# Patient Record
Sex: Female | Born: 2000
Health system: Southern US, Community
[De-identification: ages and names within clinical notes are randomized; demographics above are authoritative.]

## PROBLEM LIST (undated history)

## (undated) DIAGNOSIS — F909 Attention-deficit hyperactivity disorder, unspecified type: Secondary | ICD-10-CM

## (undated) DIAGNOSIS — L309 Dermatitis, unspecified: Secondary | ICD-10-CM

## (undated) DIAGNOSIS — L500 Allergic urticaria: Secondary | ICD-10-CM

## (undated) HISTORY — DX: Dermatitis, unspecified: L30.9

## (undated) HISTORY — DX: Allergic urticaria: L50.0

## (undated) HISTORY — DX: Attention-deficit hyperactivity disorder, unspecified type: F90.9

---
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
## 2019-01-08 ENCOUNTER — Other Ambulatory Visit: Payer: Self-pay | Admitting: Internal Medicine

## 2019-05-14 ENCOUNTER — Other Ambulatory Visit: Payer: Self-pay

## 2019-05-14 ENCOUNTER — Ambulatory Visit (INDEPENDENT_AMBULATORY_CARE_PROVIDER_SITE_OTHER): Payer: BC Managed Care – PPO | Admitting: Internal Medicine

## 2019-05-14 ENCOUNTER — Encounter: Payer: Self-pay | Admitting: Internal Medicine

## 2019-05-14 VITALS — BP 118/82 | HR 107 | Temp 98.1°F | Ht 66.0 in | Wt 226.0 lb

## 2019-05-14 DIAGNOSIS — F419 Anxiety disorder, unspecified: Secondary | ICD-10-CM

## 2019-05-14 DIAGNOSIS — Z0000 Encounter for general adult medical examination without abnormal findings: Secondary | ICD-10-CM | POA: Diagnosis not present

## 2019-05-14 DIAGNOSIS — R41840 Attention and concentration deficit: Secondary | ICD-10-CM

## 2019-05-14 DIAGNOSIS — F329 Major depressive disorder, single episode, unspecified: Secondary | ICD-10-CM

## 2019-05-14 DIAGNOSIS — K219 Gastro-esophageal reflux disease without esophagitis: Secondary | ICD-10-CM

## 2019-05-14 DIAGNOSIS — L500 Allergic urticaria: Secondary | ICD-10-CM

## 2019-05-14 DIAGNOSIS — K582 Mixed irritable bowel syndrome: Secondary | ICD-10-CM | POA: Diagnosis not present

## 2019-05-14 MED ORDER — ESCITALOPRAM OXALATE 10 MG PO TABS: 10.0000 mg | ORAL_TABLET | Freq: Every day | ORAL | 3 refills | Status: DC

## 2019-05-14 NOTE — Patient Instructions (Signed)
Health Maintenance, Female Adopting a healthy lifestyle and getting preventive care are important in promoting health and wellness. Ask your health care provider about:  The right schedule for you to have regular tests and exams.  Things you can do on your own to prevent diseases and keep yourself healthy. What should I know about diet, weight, and exercise? Eat a healthy diet   Eat a diet that includes plenty of vegetables, fruits, low-fat dairy products, and lean protein.  Do not eat a lot of foods that are high in solid fats, added sugars, or sodium. Maintain a healthy weight Body mass index (BMI) is used to identify weight problems. It estimates body fat based on height and weight. Your health care provider can help determine your BMI and help you achieve or maintain a healthy weight. Get regular exercise Get regular exercise. This is one of the most important things you can do for your health. Most adults should:  Exercise for at least 150 minutes each week. The exercise should increase your heart rate and make you sweat (moderate-intensity exercise).  Do strengthening exercises at least twice a week. This is in addition to the moderate-intensity exercise.  Spend less time sitting. Even light physical activity can be beneficial. Watch cholesterol and blood lipids Have your blood tested for lipids and cholesterol at 20 years of age, then have this test every 5 years. Have your cholesterol levels checked more often if:  Your lipid or cholesterol levels are high.  You are older than 20 years of age.  You are at high risk for heart disease. What should I know about cancer screening? Depending on your health history and family history, you may need to have cancer screening at various ages. This may include screening for:  Breast cancer.  Cervical cancer.  Colorectal cancer.  Skin cancer.  Lung cancer. What should I know about heart disease, diabetes, and high blood  pressure? Blood pressure and heart disease  High blood pressure causes heart disease and increases the risk of stroke. This is more likely to develop in people who have high blood pressure readings, are of African descent, or are overweight.  Have your blood pressure checked: ? Every 3-5 years if you are 18-39 years of age. ? Every year if you are 40 years old or older. Diabetes Have regular diabetes screenings. This checks your fasting blood sugar level. Have the screening done:  Once every three years after age 40 if you are at a normal weight and have a low risk for diabetes.  More often and at a younger age if you are overweight or have a high risk for diabetes. What should I know about preventing infection? Hepatitis B If you have a higher risk for hepatitis B, you should be screened for this virus. Talk with your health care provider to find out if you are at risk for hepatitis B infection. Hepatitis C Testing is recommended for:  Everyone born from 1945 through 1965.  Anyone with known risk factors for hepatitis C. Sexually transmitted infections (STIs)  Get screened for STIs, including gonorrhea and chlamydia, if: ? You are sexually active and are younger than 20 years of age. ? You are older than 20 years of age and your health care provider tells you that you are at risk for this type of infection. ? Your sexual activity has changed since you were last screened, and you are at increased risk for chlamydia or gonorrhea. Ask your health care provider if   you are at risk.  Ask your health care provider about whether you are at high risk for HIV. Your health care provider may recommend a prescription medicine to help prevent HIV infection. If you choose to take medicine to prevent HIV, you should first get tested for HIV. You should then be tested every 3 months for as long as you are taking the medicine. Pregnancy  If you are about to stop having your period (premenopausal) and  you may become pregnant, seek counseling before you get pregnant.  Take 400 to 800 micrograms (mcg) of folic acid every day if you become pregnant.  Ask for birth control (contraception) if you want to prevent pregnancy. Osteoporosis and menopause Osteoporosis is a disease in which the bones lose minerals and strength with aging. This can result in bone fractures. If you are 65 years old or older, or if you are at risk for osteoporosis and fractures, ask your health care provider if you should:  Be screened for bone loss.  Take a calcium or vitamin D supplement to lower your risk of fractures.  Be given hormone replacement therapy (HRT) to treat symptoms of menopause. Follow these instructions at home: Lifestyle  Do not use any products that contain nicotine or tobacco, such as cigarettes, e-cigarettes, and chewing tobacco. If you need help quitting, ask your health care provider.  Do not use street drugs.  Do not share needles.  Ask your health care provider for help if you need support or information about quitting drugs. Alcohol use  Do not drink alcohol if: ? Your health care provider tells you not to drink. ? You are pregnant, may be pregnant, or are planning to become pregnant.  If you drink alcohol: ? Limit how much you use to 0-1 drink a day. ? Limit intake if you are breastfeeding.  Be aware of how much alcohol is in your drink. In the [place] one drink equals one 12 oz bottle of beer (355 mL), one 5 oz glass of wine (148 mL), or one 1 oz glass of hard liquor (44 mL). General instructions  Schedule regular health, dental, and eye exams.  Stay current with your vaccines.  Tell your health care provider if: ? You often feel depressed. ? You have ever been abused or do not feel safe at home. Summary  Adopting a healthy lifestyle and getting preventive care are important in promoting health and wellness.  Follow your health care provider's instructions about healthy  diet, exercising, and getting tested or screened for diseases.  Follow your health care provider's instructions on monitoring your cholesterol and blood pressure. This information is not intended to replace advice given to you by your health care provider. Make sure you discuss any questions you have with your health care provider. Document Revised: 04/04/2018 Document Reviewed: 04/04/2018 Elsevier Patient Education  2020 [employer] Inc.  

## 2019-05-14 NOTE — Assessment & Plan Note (Signed)
Continue Xyzal. 

## 2019-05-14 NOTE — Progress Notes (Signed)
Subjective:    Patient ID: Jill Spencer, female    DOB: 2000-05-14, 19 y.o.   MRN: [id number]  HPI  Pt presents to the clinic today for her annual exam. She is also due to follow up chronic conditions. Patient reports joint pain likely due to fibromyalgia, rheumatology ruled out a rheumatologic cause last year. She reports forgetfulness and lack of motivation and would like to be evaluated for ADHD.   Recurrent Urticaria: Triggered by scratching, nickel, and dust which she avoids. She takes Xyzal daily for prevention.    IBS: Alternating constipation and diarrhea. Triggered by soda and dairy which she avoids. She is not taking any medications OTC for this.  GERD: She cannot identify any specific triggers. She takes Tums as needed with good relief of symptoms. There is no upper GI on file.   Anxiety and Depression: Deteriorated off Escitalopram. She would like to get restarted on this.  She is not currently seeing a therapist. She denies SI/HI.    Flu: 01/2018 Tetanus: 12/2009 Dentist: biannually Vision screening: annually  Diet: Eats meat, fruits, and veggies. Avoids fried foods. Drinks mostly water and sweet tea.  Exercise: No formal exercise.   Review of Systems      Past Medical History:  Diagnosis Date  . Eczema   . Recurrent urticaria 05/09/2016    Current Outpatient Medications  Medication Sig Dispense Refill  . diclofenac (VOLTAREN) 75 MG EC tablet Take 1 tablet (75 mg total) by mouth 2 (two) times daily. 14 tablet 0  . EPINEPHrine (EPIPEN 2-PAK) 0.3 mg/0.3 mL IJ SOAJ injection Inject 0.3 mLs (0.3 mg total) into the muscle once. 2 Device 0  . escitalopram (LEXAPRO) 10 MG tablet Take 1 tablet (10 mg total) by mouth daily. 30 tablet 11  . fluticasone (FLONASE) 50 MCG/ACT nasal spray Place 2 sprays into both nostrils daily as needed for allergies or rhinitis. 16 g 1  . levocetirizine (XYZAL) 5 MG tablet TAKE 1 TABLET BY MOUTH AT BEDTIME AS NEEDED FOR ALLERGIES 90  tablet 0  . ranitidine (ZANTAC) 150 MG tablet Take 150 mg by mouth 2 (two) times daily as needed for heartburn.     No current facility-administered medications for this visit.    Allergies  Allergen Reactions  . Latex Hives  . Sulfa Antibiotics Other (See Comments)    Flu like sx including fever    Family History  Problem Relation Age of Onset  . Hypertension Mother   . Hyperlipidemia Mother   . Cancer Maternal Grandfather        brain   . Diabetes Paternal Grandmother   . Congestive Heart Failure Paternal Grandmother   . Hypertension Father   . Eczema Paternal Uncle     Social History   Socioeconomic History  . Marital status: Single    Spouse name: Not on file  . Number of children: Not on file  . Years of education: Not on file  . Highest education level: Not on file  Occupational History  . Not on file  Tobacco Use  . Smoking status: Passive Smoke Exposure - Never Smoker  . Smokeless tobacco: Never Used  . Tobacco comment: parents, but do not smoke in home  Substance and Sexual Activity  . Alcohol use: No    Alcohol/week: 0.0 standard drinks  . Drug use: No  . Sexual activity: Never  Other Topics Concern  . Not on file  Social History Narrative  . Not on file  Social Determinants of Health   Financial Resource Strain:   . Difficulty of Paying Living Expenses: Not on file  Food Insecurity:   . Worried About Charity fundraiser in the Last Year: Not on file  . Ran Out of Food in the Last Year: Not on file  Transportation Needs:   . Lack of Transportation (Medical): Not on file  . Lack of Transportation (Non-Medical): Not on file  Physical Activity:   . Days of Exercise per Week: Not on file  . Minutes of Exercise per Session: Not on file  Stress:   . Feeling of Stress : Not on file  Social Connections:   . Frequency of Communication with Friends and Family: Not on file  . Frequency of Social Gatherings with Friends and Family: Not on file  .  Attends Religious Services: Not on file  . Active Member of Clubs or Organizations: Not on file  . Attends Archivist Meetings: Not on file  . Marital Status: Not on file  Intimate Partner Violence:   . Fear of Current or Ex-Partner: Not on file  . Emotionally Abused: Not on file  . Physically Abused: Not on file  . Sexually Abused: Not on file     Constitutional: Pt reports headaches and fatigue. Denies fever, malaise, or abrupt weight changes.  HEENT: Denies eye pain, eye redness, ear pain, ringing in the ears, wax buildup, runny nose, nasal congestion, bloody nose, or sore throat. Respiratory: Denies difficulty breathing, shortness of breath, cough or sputum production.   Cardiovascular: Denies chest pain, chest tightness, palpitations or swelling in the hands or feet.  Gastrointestinal: Pt reports constipation and diarrhea. Denies abdominal pain, bloating, or blood in the stool.  GU: Denies urgency, frequency, pain with urination, burning sensation, blood in urine, odor or discharge. Musculoskeletal: Pt reports joint pain and muscle pain. Denies decrease in range of motion, difficulty with gait, or muscle pain. Skin: Denies redness, rashes, lesions or ulcercations.  Neurological: Pt reports inattention. Denies dizziness, difficulty with speech or problems with balance and coordination.  Psych: Pt reports anxiety and depression. Denies SI/HI.  No other specific complaints in a complete review of systems (except as listed in HPI above).  Objective:   Physical Exam BP 118/82   Pulse (!) 107   Temp 98.1 F (36.7 C) (Temporal)   Ht 5\' 6"  (1.676 m)   Wt 226 lb (102.5 kg)   LMP 05/12/2019   SpO2 98%   BMI 36.48 kg/m   Wt Readings from Last 3 Encounters:  02/05/18 226 lb 6.4 oz (102.7 kg) (99 %, Z= 2.26)*  07/27/17 227 lb (103 kg) (99 %, Z= 2.27)*  02/14/17 226 lb (102.5 kg) (99 %, Z= 2.27)*   * Growth percentiles are based on CDC (Girls, 2-20 Years) data.     General: Appears her stated age, obese, in NAD. Skin: Warm, dry and intact. No rashes noted. HEENT: Head: normal shape and size; Eyes: sclera white, no icterus, conjunctiva pink, PERRLA and EOMs intact.  Cardiovascular: Normal rate and rhythm. S1,S2 noted.  No murmur, rubs or gallops noted. No JVD or BLE edema.  Pulmonary/Chest: Normal effort and positive vesicular breath sounds. No respiratory distress. No wheezes, rales or ronchi noted.  Abdomen: Soft and nontender. Normal bowel sounds. No distention or masses noted. Liver, spleen and kidneys non palpable. Musculoskeletal:  Strength 5/5 BLE/BUE. No difficulty with gait. Neurological: Alert and oriented. Cranial nerves II-XII grossly intact. Coordination normal.  Psychiatric: Depressed mood  with flat affect. Judgment and thought content normal.     BMET    Component Value Date/Time   NA 141 06/21/2016 1430   K 3.9 06/21/2016 1430   CL 107 06/21/2016 1430   CO2 23 06/21/2016 1430   GLUCOSE 86 06/21/2016 1430   BUN 8 06/21/2016 1430   CREATININE 0.61 06/21/2016 1430   CALCIUM 9.3 06/21/2016 1430    Lipid Panel  No results found for: CHOL, TRIG, HDL, CHOLHDL, VLDL, LDLCALC  CBC    Component Value Date/Time   WBC 7.8 06/21/2016 1430   RBC 4.71 06/21/2016 1430   HGB 11.4 (L) 06/21/2016 1430   HCT 36.3 06/21/2016 1430   PLT 291 06/21/2016 1430   MCV 77.1 (L) 06/21/2016 1430   MCH 24.2 (L) 06/21/2016 1430   MCHC 31.4 06/21/2016 1430   RDW 16.4 (H) 06/21/2016 1430   LYMPHSABS 2,730 06/21/2016 1430   MONOABS 468 06/21/2016 1430   EOSABS 312 06/21/2016 1430   BASOSABS 78 06/21/2016 1430    Hgb A1C No results found for: HGBA1C          Assessment & Plan:   Preventative Health Maintenance:  She declines flu shot today Tetanus: UTD Encouraged her to consume a balanced diet and exercise regimen Advised her to see a dentist annually Will check CBC, CMET and Lipid profile today Referred to psychology for  inattention.  Inattention:  She has concerns about ADD Referral to Dr. Reggy Eye for ADD testing  RTC in 1 year, sooner if needed Nicki Reaper, NP This visit occurred during the SARS-CoV-2 public health emergency.  Safety protocols were in place, including screening questions prior to the visit, additional usage of staff PPE, and extensive cleaning of exam room while observing appropriate contact time as indicated for disinfecting solutions.

## 2019-05-14 NOTE — Assessment & Plan Note (Signed)
Managed with diet Will monitor 

## 2019-05-14 NOTE — Assessment & Plan Note (Signed)
Deteriorated Will restart Escitalopram Discussed the importance of taking medication as prescribed, not abruptly stopping this medication Support offered today

## 2019-05-14 NOTE — Assessment & Plan Note (Signed)
Continue Tums as needed Avoid foods that trigger your reflux

## 2019-05-15 LAB — CBC
HCT: 36.7 % (ref 36.0–49.0)
Hemoglobin: 11.5 g/dL — ABNORMAL LOW (ref 12.0–16.0)
MCHC: 31.4 g/dL (ref 31.0–37.0)
MCV: 75.7 fl — ABNORMAL LOW (ref 78.0–98.0)
Platelets: 279 10*3/uL (ref 150.0–575.0)
RBC: 4.84 Mil/uL (ref 3.80–5.70)
RDW: 17 % — ABNORMAL HIGH (ref 11.4–15.5)
WBC: 6.4 10*3/uL (ref 4.5–13.5)

## 2019-05-15 LAB — COMPREHENSIVE METABOLIC PANEL
ALT: 14 U/L (ref 0–35)
AST: 16 U/L (ref 0–37)
Albumin: 4.1 g/dL (ref 3.5–5.2)
Alkaline Phosphatase: 79 U/L (ref 47–119)
BUN: 8 mg/dL (ref 6–23)
CO2: 27 mEq/L (ref 19–32)
Calcium: 9.4 mg/dL (ref 8.4–10.5)
Chloride: 105 mEq/L (ref 96–112)
Creatinine, Ser: 0.65 mg/dL (ref 0.40–1.20)
GFR: 116.42 mL/min (ref >60.00)
Glucose, Bld: 96 mg/dL (ref 70–99)
Potassium: 4.2 mEq/L (ref 3.5–5.1)
Sodium: 138 mEq/L (ref 135–145)
Total Bilirubin: 0.3 mg/dL (ref 0.2–1.2)
Total Protein: 7.6 g/dL (ref 6.0–8.3)

## 2019-05-15 LAB — LDL CHOLESTEROL, DIRECT: Direct LDL: 89 mg/dL

## 2019-05-15 LAB — LIPID PANEL
Cholesterol: 168 mg/dL (ref 0–200)
HDL: 36.9 mg/dL — ABNORMAL LOW (ref >39.00)
NonHDL: 131.43
Total CHOL/HDL Ratio: 5
Triglycerides: 400 mg/dL — ABNORMAL HIGH (ref 0.0–149.0)
VLDL: 80 mg/dL — ABNORMAL HIGH (ref 0.0–40.0)

## 2019-06-07 ENCOUNTER — Other Ambulatory Visit: Payer: Self-pay | Admitting: Internal Medicine

## 2019-06-07 DIAGNOSIS — J3089 Other allergic rhinitis: Secondary | ICD-10-CM

## 2019-06-27 ENCOUNTER — Other Ambulatory Visit: Payer: Self-pay

## 2019-06-27 NOTE — Progress Notes (Signed)
COLTON ENGDAHL DOB: 2001/03/07 Encounter date: 06/28/2019  This is a 19 y.o. female who presents to establish care. Chief Complaint  Patient presents with  . Establish Care    History of present illness: Very tired, and anxious today. Sleep schedule is always out of whack.   Has chronic pain. Talked with last doctor about this and was told there wasn't anything to do.   Joints always hurt, pop all the time. Saw rheumatology in past and was told she was hypermobile. Sometimes specific joint bothers her for period of time - like a couple of months ago was her right knee. Has been having issue for 3-4 years. Seemed to get worse quickly. Her brother has similar issues - turning 16 in 2 days. Saw peds rheumatology in 02/2017.   Started with hands and wrists, but thinks this was due to her being in high school and having art classes. Hasn't been able to see swelling within joints. Takes ibuprofen when it hurts and sometimes it helps. Takes 400mg  just occasionally. At worst, pain is 6/10. This has been very limiting for her.  Eczema: hasn't had medicine for this since she was little. Gets flares elbows, around eyes, and middle of back. Using a lot of lotion. Uses aveno.   Allergic to a lot of things. At time of joint issues was very stressed out and then started with hives. Also has dermatographia. Has issues with grasses, pollen.   Occasional bouts with irritable bowels - just more pain; not constipation/not diarrhea.  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   Past Medical History:  Diagnosis Date  . Eczema   . Recurrent urticaria 05/09/2016   History reviewed. No pertinent surgical history. Allergies  Allergen Reactions  . Dust Mite Extract   . Latex Hives  . Shellfish Allergy   . Sulfa Antibiotics Other (See Comments)    Flu like sx including fever   No outpatient medications have been marked as taking for the 06/28/19 encounter  (Office Visit) with 08/28/19, MD.   Social History   Tobacco Use  . Smoking status: Passive Smoke Exposure - Never Smoker  . Smokeless tobacco: Never Used  . Tobacco comment: parents, but do not smoke in home  Substance Use Topics  . Alcohol use: No    Alcohol/week: 0.0 standard drinks   Family History  Problem Relation Age of Onset  . Hypertension Mother   . Hyperlipidemia Mother   . Cancer Maternal Grandfather        brain   . Diabetes Paternal Grandmother   . Congestive Heart Failure Paternal Grandmother   . Hypertension Father   . Arthritis Father   . Eczema Paternal Uncle   . Cancer Paternal Grandfather        skin  . Migraines Sister   . Multiple sclerosis Other        paternal aunt; not entirely sure of diagnosis     Review of Systems  Constitutional: Negative for chills, fatigue and fever.  Respiratory: Negative for cough, chest tightness, shortness of breath and wheezing.   Cardiovascular: Negative for chest pain, palpitations and leg swelling.  Musculoskeletal: Positive for arthralgias.    Objective:  BP 116/80 (BP Location: Right Arm, Patient Position: Sitting, Cuff Size: Large)   Pulse (!) 134   Temp 97.9 F (36.6 C) (Temporal)   Ht 5\' 6"  (1.676 m)   Wt 222 lb (100.7 kg)  LMP 06/16/2019 (Approximate)   BMI 35.83 kg/m   Weight: 222 lb (100.7 kg)   BP Readings from Last 3 Encounters:  06/28/19 116/80  05/14/19 118/82  05/08/18 131/74   Wt Readings from Last 3 Encounters:  06/28/19 222 lb (100.7 kg) (99 %, Z= 2.23)*  05/14/19 226 lb (102.5 kg) (99 %, Z= 2.28)*  02/05/18 226 lb 6.4 oz (102.7 kg) (99 %, Z= 2.26)*   * Growth percentiles are based on CDC (Girls, 2-20 Years) data.    Physical Exam Constitutional:      General: She is not in acute distress.    Appearance: She is well-developed.  Cardiovascular:     Rate and Rhythm: Normal rate and regular rhythm.     Heart sounds: Normal heart sounds. No murmur. No friction rub.   Pulmonary:     Effort: Pulmonary effort is normal. No respiratory distress.     Breath sounds: Normal breath sounds. No wheezing or rales.  Musculoskeletal:     Right lower leg: No edema.     Left lower leg: No edema.     Comments: No joint swelling appreciated in hands.  She is very flexible with ability to bend thumb entirely back onto forearm. Can bend pinky back past 90 degrees.   Neurological:     Mental Status: She is alert and oriented to person, place, and time.  Psychiatric:        Behavior: Behavior normal.     Assessment/Plan:  1. Anemia, unspecified type - Iron, TIBC and Ferritin Panel; Future - Iron, TIBC and Ferritin Panel  2. Arthralgia, unspecified joint Start with bloodwork; consider further evaluation pending results. She does have hypermobility of joints; if bloodwork is negative for autoimmune disease/inflammatory concern, I would consider further evaluation for her hypermobile joints. She is also willing to try physical therapy to help with pain/discomfort. - ANA; Future - Rheumatoid factor; Future - Sedimentation rate; Future - C-reactive protein; Future - C-reactive protein - Sedimentation rate - Rheumatoid factor - ANA - B. burgdorfi antibodies  3. Fatigue, unspecified type - Vitamin B12; Future - TSH; Future - VITAMIN D 25 Hydroxy (Vit-D Deficiency, Fractures); Future - VITAMIN D 25 Hydroxy (Vit-D Deficiency, Fractures) - TSH - Vitamin B12  4. Eczema, unspecified type Has been able to control with lotion and rare use of steroid cream.   Return for pending lab results.  Micheline Rough, MD

## 2019-06-28 ENCOUNTER — Ambulatory Visit (INDEPENDENT_AMBULATORY_CARE_PROVIDER_SITE_OTHER): Payer: BC Managed Care – PPO | Admitting: Family Medicine

## 2019-06-28 ENCOUNTER — Encounter: Payer: Self-pay | Admitting: Family Medicine

## 2019-06-28 VITALS — BP 116/80 | HR 134 | Temp 97.9°F | Ht 66.0 in | Wt 222.0 lb

## 2019-06-28 DIAGNOSIS — L309 Dermatitis, unspecified: Secondary | ICD-10-CM

## 2019-06-28 DIAGNOSIS — D649 Anemia, unspecified: Secondary | ICD-10-CM | POA: Diagnosis not present

## 2019-06-28 DIAGNOSIS — M2550 Pain in unspecified joint: Secondary | ICD-10-CM | POA: Diagnosis not present

## 2019-06-28 DIAGNOSIS — R5383 Other fatigue: Secondary | ICD-10-CM | POA: Diagnosis not present

## 2019-06-28 DIAGNOSIS — R768 Other specified abnormal immunological findings in serum: Secondary | ICD-10-CM

## 2019-06-28 MED ORDER — TRIAMCINOLONE ACETONIDE 0.1 % EX CREA: 1.0000 "application " | TOPICAL_CREAM | Freq: Two times a day (BID) | CUTANEOUS | 0 refills | Status: DC

## 2019-06-28 NOTE — Patient Instructions (Signed)
Consider original eucerin (or aquaphor).

## 2019-07-01 LAB — C-REACTIVE PROTEIN: CRP: 10.7 mg/L — ABNORMAL HIGH (ref <8.0)

## 2019-07-01 LAB — IRON,TIBC AND FERRITIN PANEL
%SAT: 6 % (calc) — ABNORMAL LOW (ref 15–45)
Ferritin: 13 ng/mL — ABNORMAL LOW (ref 16–154)
Iron: 25 ug/dL — ABNORMAL LOW (ref 27–164)
TIBC: 414 mcg/dL (calc) (ref 271–448)

## 2019-07-01 LAB — ANA: Anti Nuclear Antibody (ANA): POSITIVE — AB

## 2019-07-01 LAB — RHEUMATOID FACTOR: Rheumatoid fact SerPl-aCnc: <14 IU/mL (ref <14)

## 2019-07-01 LAB — ANTI-NUCLEAR AB-TITER (ANA TITER): ANA Titer 1: 1:40 {titer} — ABNORMAL HIGH

## 2019-07-01 LAB — VITAMIN B12: Vitamin B-12: 601 pg/mL (ref 200–1100)

## 2019-07-01 LAB — SEDIMENTATION RATE: Sed Rate: 31 mm/h — ABNORMAL HIGH (ref 0–20)

## 2019-07-01 LAB — VITAMIN D 25 HYDROXY (VIT D DEFICIENCY, FRACTURES): Vit D, 25-Hydroxy: 9 ng/mL — ABNORMAL LOW (ref 30–100)

## 2019-07-01 LAB — TSH: TSH: 1.78 mIU/L

## 2019-07-01 LAB — B. BURGDORFI ANTIBODIES: B burgdorferi Ab IgG+IgM: <0.9 index

## 2019-07-03 MED ORDER — FERROUS SULFATE 325 (65 FE) MG PO TABS: 325.0000 mg | ORAL_TABLET | Freq: Every day | ORAL | 3 refills | Status: DC

## 2019-07-03 MED ORDER — VITAMIN D (ERGOCALCIFEROL) 1.25 MG (50000 UNIT) PO CAPS: 50000.0000 [IU] | ORAL_CAPSULE | ORAL | 1 refills | Status: DC

## 2019-07-03 NOTE — Addendum Note (Signed)
Addended by: Waymon Amato R on: 07/03/2019 08:24 AM   Modules accepted: Orders

## 2019-07-05 ENCOUNTER — Other Ambulatory Visit: Payer: Self-pay

## 2019-07-05 DIAGNOSIS — T7800XA Anaphylactic reaction due to unspecified food, initial encounter: Secondary | ICD-10-CM

## 2019-07-10 ENCOUNTER — Other Ambulatory Visit: Payer: Self-pay | Admitting: Internal Medicine

## 2019-07-10 DIAGNOSIS — J3089 Other allergic rhinitis: Secondary | ICD-10-CM

## 2019-07-17 ENCOUNTER — Encounter: Payer: Self-pay | Admitting: Family Medicine

## 2019-07-23 ENCOUNTER — Other Ambulatory Visit: Payer: Self-pay | Admitting: Internal Medicine

## 2019-07-23 DIAGNOSIS — J3089 Other allergic rhinitis: Secondary | ICD-10-CM

## 2019-09-02 ENCOUNTER — Ambulatory Visit (INDEPENDENT_AMBULATORY_CARE_PROVIDER_SITE_OTHER): Payer: BC Managed Care – PPO | Admitting: Psychology

## 2019-09-02 DIAGNOSIS — F419 Anxiety disorder, unspecified: Secondary | ICD-10-CM

## 2019-09-02 DIAGNOSIS — F319 Bipolar disorder, unspecified: Secondary | ICD-10-CM | POA: Diagnosis not present

## 2019-09-03 NOTE — Progress Notes (Signed)
Office Visit Note  Patient: Jill Spencer             Date of Birth: 05/22/2000           MRN: [id number]             PCP: Caren Macadam, MD Referring: Billie Ruddy, MD Visit Date: 09/11/2019 Occupation: @GUAROCC @  Subjective:  New Patient (Initial Visit) (Total body joint pain)   History of Present Illness: Jill Spencer is a 19 y.o. female seen in consultation per request of her PCP.  According to the patient her symptoms are started in 2016 with generalized pain all over.  At the time she was seen in neurology by a pediatric rheumatologist and was diagnosed with hypermobility.  She states she continues to have pain in multiple joints.  Increased activity causes increased pain.  She denies any history of joint swelling.  She describes pain in her cervical, thoracic and lumbar spine.  She also has discomfort in her left wrist joint, bilateral hands, bilateral hips, bilateral ankles and feet.  She states since 2016 she has also had episodes of recurrent urticaria.  She was evaluated by an allergist and allergy testing was positive for few things.  She was advised Xyzal which she had been taking and had been controlling her symptoms.  But recently is not covered by her insurance and she has been using some over-the-counter medications which are helpful.  Patient states she was having painful urination yesterday and was seen at urgent care.  She was also sent to emergency room to have a CT scan to look for bladder stone.  She states she waited for too long and left.  She is planning to go back there today.  She states there is history of autoimmune disease in her maternal aunt but she does not know which kind as she does not have contact with her.  There is no history of oral ulcers, nasal ulcers, malar rash, photosensitivity, lymphadenopathy or Raynaud's.  Activities of Daily Living:  Patient reports morning stiffness for 0 none.   Patient Denies nocturnal pain.  Difficulty  dressing/grooming: Denies Difficulty climbing stairs: Reports Difficulty getting out of chair: Reports Difficulty using hands for taps, buttons, cutlery, and/or writing: Denies  Review of Systems  Constitutional: Positive for fatigue. Negative for night sweats, weight gain and weight loss.  HENT: Positive for mouth dryness. Negative for mouth sores, trouble swallowing, trouble swallowing and nose dryness.   Eyes: Positive for dryness. Negative for pain, redness and visual disturbance.  Respiratory: Negative for cough, shortness of breath and difficulty breathing.   Cardiovascular: Negative for chest pain, palpitations, hypertension, irregular heartbeat and swelling in legs/feet.  Gastrointestinal: Positive for constipation and diarrhea. Negative for blood in stool.  Endocrine: Positive for cold intolerance. Negative for increased urination.  Genitourinary: Positive for difficulty urinating and painful urination. Negative for vaginal dryness.  Musculoskeletal: Positive for arthralgias, gait problem, joint pain and muscle tenderness. Negative for joint swelling, myalgias, muscle weakness, morning stiffness and myalgias.  Skin: Negative for color change, rash, hair loss, skin tightness, ulcers and sensitivity to sunlight.  Allergic/Immunologic: Negative for susceptible to infections.  Neurological: Negative for dizziness, numbness, memory loss, night sweats and weakness.  Hematological: Negative for bruising/bleeding tendency and swollen glands.  Psychiatric/Behavioral: Positive for depressed mood and sleep disturbance. The patient is nervous/anxious.     PMFS History:  Patient Active Problem List   Diagnosis Date Noted  . Acid reflux 06/20/2016  .  Recurrent urticaria 05/09/2016  . Anxiety and depression 10/13/2014  . IBS (irritable bowel syndrome) 10/13/2014    Past Medical History:  Diagnosis Date  . Eczema   . Recurrent urticaria 05/09/2016    Family History  Problem Relation Age  of Onset  . Hypertension Mother   . Hyperlipidemia Mother   . Cancer Maternal Grandfather        brain   . Diabetes Paternal Grandmother   . Congestive Heart Failure Paternal Grandmother   . Hypertension Father   . Arthritis Father   . Eczema Paternal Uncle   . Cancer Paternal Grandfather        skin  . Migraines Sister   . Multiple sclerosis Other        paternal aunt; not entirely sure of diagnosis   History reviewed. No pertinent surgical history. Social History   Social History Narrative  . Not on file   Immunization History  Administered Date(s) Administered  . DTaP 09/21/1999, 11/22/1999, 01/24/2000, 01/19/2001, 08/05/2004  . Hepatitis B 1999/11/08, 08/26/1999, 05/09/2000  . HiB (PRP-OMP) 09/21/1999, 11/22/1999, 01/24/2000, 10/20/2000  . IPV 09/21/1999, 11/22/1999, 01/19/2002, 08/05/2004  . Influenza,inj,Quad PF,6+ Mos 03/12/2015  . MMR 10/20/2000, 08/05/2004  . Meningococcal Conjugate 01/13/2010  . Meningococcal Polysaccharide 03/15/2016  . Pneumococcal Conjugate-13 09/21/1999, 11/22/1999, 01/25/2000, 06/30/2000  . Tdap 01/13/2010  . Varicella 10/20/2000     Objective: Vital Signs: BP 133/76 (BP Location: Right Arm, Patient Position: Sitting, Cuff Size: Normal)   Pulse (!) 122   Resp 14   Ht 5' 6"  (1.676 m)   Wt 225 lb (102.1 kg)   BMI 36.32 kg/m    Physical Exam Vitals and nursing note reviewed.  Constitutional:      Appearance: She is well-developed.  HENT:     Head: Normocephalic and atraumatic.  Eyes:     Conjunctiva/sclera: Conjunctivae normal.  Cardiovascular:     Rate and Rhythm: Normal rate and regular rhythm.     Heart sounds: Normal heart sounds.  Pulmonary:     Effort: Pulmonary effort is normal.     Breath sounds: Normal breath sounds.  Abdominal:     General: Bowel sounds are normal.     Palpations: Abdomen is soft.  Musculoskeletal:     Cervical back: Normal range of motion.  Lymphadenopathy:     Cervical: No cervical adenopathy.    Skin:    General: Skin is warm and dry.     Capillary Refill: Capillary refill takes less than 2 seconds.  Neurological:     Mental Status: She is alert and oriented to person, place, and time.  Psychiatric:        Behavior: Behavior normal.      Musculoskeletal Exam: C-spine thoracic and lumbar spine were in good range of motion.  She had no point tenderness no SI joint tenderness.  Shoulder joints, elbow joints, wrist joints with good range of motion.  She has some hypermobility in her elbows, wrist joints, MCPs and PIPs.  No synovitis was noted.  No joint tenderness was noted.  Hip joints were in good range of motion.  She has hyperextension of her bilateral knee joints.  She has increased mobility in her ankles.  No plantar fasciitis or Achilles tendinitis was noted. CDAI Exam: CDAI Score: -- Patient Global: --; Provider Global: -- Swollen: --; Tender: -- Joint Exam 09/11/2019   No joint exam has been documented for this visit   There is currently no information documented on the homunculus. Go to  the Rheumatology activity and complete the homunculus joint exam.  Investigation: No additional findings.  Imaging: No results found.  Recent Labs: Lab Results  Component Value Date   WBC 9.6 09/11/2019   HGB 10.7 (L) 09/11/2019   PLT 216 09/11/2019   NA 136 09/11/2019   K 3.5 09/11/2019   CL 103 09/11/2019   CO2 22 09/11/2019   GLUCOSE 129 (H) 09/11/2019   BUN <5 (L) 09/11/2019   CREATININE 0.81 09/11/2019   BILITOT 0.3 05/14/2019   ALKPHOS 79 05/14/2019   AST 16 05/14/2019   ALT 14 05/14/2019   PROT 7.6 05/14/2019   ALBUMIN 4.1 05/14/2019   CALCIUM 8.7 (L) 09/11/2019   GFRAA >60 09/11/2019    Speciality Comments: No specialty comments available.  Procedures:  No procedures performed Allergies: Dust mite extract, Latex, Nickel, Shellfish allergy, and Sulfa antibiotics   Assessment / Plan:     Visit Diagnoses: Positive ANA (antinuclear antibody) - 06/28/19: ANA  1:40NS, ESR 31, RF<14, B burg ab<0.90, CRP 10.7, Vit B12 601, TSH 1.78, Vit D 9, Ferritin 13, iron 25.  ANA titer is very low and she does not have any clinical features of autoimmune disease.  I will obtainAVISE labs to explore this further.  Hypermobility arthralgia-she has been diagnosed with hypermobility arthralgia few years back.  I agree with the diagnosis.  She does have hypermobility arthralgia.  She would benefit from isometric exercises.  I will refer her to physical therapy.  I also discussed swimming and water aerobics which will be helpful for her.  Recurrent urticaria-she had evaluation by an allergist in the past.  She states over the counter medications have been helpful.  Dysuria-patient states that she was seen at urgent care and had UA analysis there.  She is also going to urgent care for further work-up.  Gastroesophageal reflux disease without esophagitis  Irritable bowel syndrome with both constipation and diarrhea-she continues to have symptoms.  Anxiety and depression-she is on medications.  Orders: Orders Placed This Encounter  Procedures  . Ambulatory referral to Physical Therapy   No orders of the defined types were placed in this encounter.   Face-to-face time spent with patient was 45 minutes. Greater than 50% of time was spent in counseling and coordination of care.  Follow-Up Instructions: Return for Positive ANA, hypermobility arthralgia.   Bo Merino, MD  Note - This record has been created using Editor, commissioning.  Chart creation errors have been sought, but may not always  have been located. Such creation errors do not reflect on  the standard of medical care.

## 2019-09-10 ENCOUNTER — Other Ambulatory Visit: Payer: Self-pay

## 2019-09-10 ENCOUNTER — Emergency Department (HOSPITAL_COMMUNITY)
Admission: EM | Admit: 2019-09-10 | Discharge: 2019-09-11 | Disposition: A | Discharge status: Home / Self Care | Payer: BC Managed Care – PPO | Attending: Emergency Medicine | Admitting: Emergency Medicine

## 2019-09-10 ENCOUNTER — Encounter (HOSPITAL_COMMUNITY): Payer: Self-pay | Admitting: Emergency Medicine

## 2019-09-10 DIAGNOSIS — R300 Dysuria: Secondary | ICD-10-CM | POA: Diagnosis not present

## 2019-09-10 DIAGNOSIS — Z5321 Procedure and treatment not carried out due to patient leaving prior to being seen by health care provider: Secondary | ICD-10-CM | POA: Diagnosis not present

## 2019-09-10 DIAGNOSIS — N12 Tubulo-interstitial nephritis, not specified as acute or chronic: Secondary | ICD-10-CM | POA: Diagnosis not present

## 2019-09-10 DIAGNOSIS — R109 Unspecified abdominal pain: Secondary | ICD-10-CM | POA: Insufficient documentation

## 2019-09-10 DIAGNOSIS — R509 Fever, unspecified: Secondary | ICD-10-CM | POA: Diagnosis not present

## 2019-09-10 LAB — CBC
HCT: 36.3 % (ref 36.0–46.0)
Hemoglobin: 11.2 g/dL — ABNORMAL LOW (ref 12.0–15.0)
MCH: 24.3 pg — ABNORMAL LOW (ref 26.0–34.0)
MCHC: 30.9 g/dL (ref 30.0–36.0)
MCV: 78.9 fL — ABNORMAL LOW (ref 80.0–100.0)
Platelets: 259 10*3/uL (ref 150–400)
RBC: 4.6 MIL/uL (ref 3.87–5.11)
RDW: 17.2 % — ABNORMAL HIGH (ref 11.5–15.5)
WBC: 10.1 10*3/uL (ref 4.0–10.5)
nRBC: 0 % (ref 0.0–0.2)

## 2019-09-10 LAB — I-STAT BETA HCG BLOOD, ED (MC, WL, AP ONLY): I-stat hCG, quantitative: <5 m[IU]/mL (ref <5)

## 2019-09-10 LAB — BASIC METABOLIC PANEL
Anion gap: 9 (ref 5–15)
BUN: 5 mg/dL — ABNORMAL LOW (ref 6–20)
CO2: 23 mmol/L (ref 22–32)
Calcium: 9.1 mg/dL (ref 8.9–10.3)
Chloride: 104 mmol/L (ref 98–111)
Creatinine, Ser: 0.75 mg/dL (ref 0.44–1.00)
GFR calc Af Amer: >60 mL/min (ref >60)
GFR calc non Af Amer: >60 mL/min (ref >60)
Glucose, Bld: 110 mg/dL — ABNORMAL HIGH (ref 70–99)
Potassium: 4.3 mmol/L (ref 3.5–5.1)
Sodium: 136 mmol/L (ref 135–145)

## 2019-09-10 MED ORDER — ACETAMINOPHEN 325 MG PO TABS
650.0000 mg | ORAL_TABLET | Freq: Once | ORAL | Status: AC
  Administered 2019-09-10: 650 mg via ORAL
  Filled 2019-09-10: qty 2

## 2019-09-10 NOTE — ED Triage Notes (Signed)
Pt endorses left kidney pain for 2-3 days with nausea. Reports she had a fever yesterday. Given tylenol at [facility] about 20 mins ago.

## 2019-09-10 NOTE — ED Notes (Signed)
Pt came to this tech requesting update on waiting. Pt was informed of wait time. Pt decided to leave, pt was encouraged to stay, pt left

## 2019-09-11 ENCOUNTER — Ambulatory Visit: Payer: BC Managed Care – PPO | Admitting: Rheumatology

## 2019-09-11 ENCOUNTER — Encounter: Payer: Self-pay | Admitting: Rheumatology

## 2019-09-11 ENCOUNTER — Encounter (HOSPITAL_COMMUNITY): Payer: Self-pay | Admitting: Emergency Medicine

## 2019-09-11 ENCOUNTER — Emergency Department (HOSPITAL_COMMUNITY)
Admission: EM | Admit: 2019-09-11 | Discharge: 2019-09-11 | Disposition: A | Discharge status: Home / Self Care | Payer: BC Managed Care – PPO | Source: Home / Self Care | Attending: Emergency Medicine | Admitting: Emergency Medicine

## 2019-09-11 VITALS — BP 133/76 | HR 122 | Resp 14 | Ht 66.0 in | Wt 225.0 lb

## 2019-09-11 DIAGNOSIS — M2550 Pain in unspecified joint: Secondary | ICD-10-CM

## 2019-09-11 DIAGNOSIS — R109 Unspecified abdominal pain: Secondary | ICD-10-CM | POA: Diagnosis not present

## 2019-09-11 DIAGNOSIS — K219 Gastro-esophageal reflux disease without esophagitis: Secondary | ICD-10-CM

## 2019-09-11 DIAGNOSIS — K582 Mixed irritable bowel syndrome: Secondary | ICD-10-CM

## 2019-09-11 DIAGNOSIS — F329 Major depressive disorder, single episode, unspecified: Secondary | ICD-10-CM

## 2019-09-11 DIAGNOSIS — F419 Anxiety disorder, unspecified: Secondary | ICD-10-CM

## 2019-09-11 DIAGNOSIS — L500 Allergic urticaria: Secondary | ICD-10-CM | POA: Diagnosis not present

## 2019-09-11 DIAGNOSIS — R768 Other specified abnormal immunological findings in serum: Secondary | ICD-10-CM | POA: Diagnosis not present

## 2019-09-11 DIAGNOSIS — N12 Tubulo-interstitial nephritis, not specified as acute or chronic: Secondary | ICD-10-CM | POA: Diagnosis not present

## 2019-09-11 DIAGNOSIS — Z5321 Procedure and treatment not carried out due to patient leaving prior to being seen by health care provider: Secondary | ICD-10-CM | POA: Diagnosis not present

## 2019-09-11 LAB — CBC WITH DIFFERENTIAL/PLATELET
Abs Immature Granulocytes: 0.04 10*3/uL (ref 0.00–0.07)
Basophils Absolute: 0 10*3/uL (ref 0.0–0.1)
Basophils Relative: 0 %
Eosinophils Absolute: 0 10*3/uL (ref 0.0–0.5)
Eosinophils Relative: 0 %
HCT: 35.3 % — ABNORMAL LOW (ref 36.0–46.0)
Hemoglobin: 10.7 g/dL — ABNORMAL LOW (ref 12.0–15.0)
Immature Granulocytes: 0 %
Lymphocytes Relative: 9 %
Lymphs Abs: 0.8 10*3/uL (ref 0.7–4.0)
MCH: 24.3 pg — ABNORMAL LOW (ref 26.0–34.0)
MCHC: 30.3 g/dL (ref 30.0–36.0)
MCV: 80.2 fL (ref 80.0–100.0)
Monocytes Absolute: 0.6 10*3/uL (ref 0.1–1.0)
Monocytes Relative: 6 %
Neutro Abs: 8.1 10*3/uL — ABNORMAL HIGH (ref 1.7–7.7)
Neutrophils Relative %: 85 %
Platelets: 216 10*3/uL (ref 150–400)
RBC: 4.4 MIL/uL (ref 3.87–5.11)
RDW: 17.4 % — ABNORMAL HIGH (ref 11.5–15.5)
WBC: 9.6 10*3/uL (ref 4.0–10.5)
nRBC: 0 % (ref 0.0–0.2)

## 2019-09-11 LAB — BASIC METABOLIC PANEL
Anion gap: 11 (ref 5–15)
BUN: <5 mg/dL — ABNORMAL LOW (ref 6–20)
CO2: 22 mmol/L (ref 22–32)
Calcium: 8.7 mg/dL — ABNORMAL LOW (ref 8.9–10.3)
Chloride: 103 mmol/L (ref 98–111)
Creatinine, Ser: 0.81 mg/dL (ref 0.44–1.00)
GFR calc Af Amer: >60 mL/min (ref >60)
GFR calc non Af Amer: >60 mL/min (ref >60)
Glucose, Bld: 129 mg/dL — ABNORMAL HIGH (ref 70–99)
Potassium: 3.5 mmol/L (ref 3.5–5.1)
Sodium: 136 mmol/L (ref 135–145)

## 2019-09-11 LAB — I-STAT BETA HCG BLOOD, ED (MC, WL, AP ONLY): I-stat hCG, quantitative: <5 m[IU]/mL (ref <5)

## 2019-09-11 LAB — URINALYSIS, ROUTINE W REFLEX MICROSCOPIC
Bilirubin Urine: NEGATIVE
Glucose, UA: NEGATIVE mg/dL
Ketones, ur: NEGATIVE mg/dL
Nitrite: POSITIVE — AB
Protein, ur: 30 mg/dL — AB
Specific Gravity, Urine: 1.02 (ref 1.005–1.030)
WBC, UA: >50 WBC/hpf — ABNORMAL HIGH (ref 0–5)
pH: 6 (ref 5.0–8.0)

## 2019-09-11 MED ORDER — CIPROFLOXACIN HCL 500 MG PO TABS
500.0000 mg | ORAL_TABLET | Freq: Two times a day (BID) | ORAL | 0 refills | Status: DC
Start: 2019-09-11 — End: 2019-09-12

## 2019-09-11 NOTE — ED Triage Notes (Signed)
Pt back today for same complaint as yesterday, possible UTI/ kidney pain , pt left yesterday due to wait times

## 2019-09-11 NOTE — Discharge Instructions (Addendum)
Based on our conversation, I am prescribing you an antibiotic called ciprofloxacin.  You are going to take this twice a day for 1 week.  Please do not stop taking this antibiotic early.  Please try to take it with some food.  You can take ibuprofen and Tylenol for management of your pain.  If your symptoms begin to worsen please return to the emergency department for further evaluation.  It was a pleasure to meet you.

## 2019-09-11 NOTE — ED Provider Notes (Signed)
[facility] EMERGENCY DEPARTMENT Provider Note   CSN: [id number] Arrival date & time: 09/11/19  3664     History No chief complaint on file.   Jill Spencer is a 19 y.o. female.  HPI HPI Comments: Jill Spencer is a 19 y.o. female who presents to the Emergency Department complaining of dysuria.  About 1 week ago she began experiencing mild dysuria as well as increased urinary frequency.  She went to urgent care yesterday because her symptoms have continued to worsen and 3 days ago she began experiencing palpable left flank pain.  Her pain is 4/10 at rest and 7/10 with palpation.  Urgent care advised her to come to the emergency department for kidney stone rule out.  She left last night due to the wait and came back this morning.  She states her symptoms have not alleviated.  She reports associated intermittent fevers noting that her temperature was 100.4 at urgent care yesterday.  No fever today. She additionally reports associated chills, nausea without vomiting, intermittent lightheadedness.  She denies URI symptoms, chest pain, shortness of breath, hematuria, difficulty urinating, syncope.      Past Medical History:  Diagnosis Date  . Eczema   . Recurrent urticaria 05/09/2016    Patient Active Problem List   Diagnosis Date Noted  . Acid reflux 06/20/2016  . Recurrent urticaria 05/09/2016  . Anxiety and depression 10/13/2014  . IBS (irritable bowel syndrome) 10/13/2014    History reviewed. No pertinent surgical history.   OB History   No obstetric history on file.     Family History  Problem Relation Age of Onset  . Hypertension Mother   . Hyperlipidemia Mother   . Cancer Maternal Grandfather        brain   . Diabetes Paternal Grandmother   . Congestive Heart Failure Paternal Grandmother   . Hypertension Father   . Arthritis Father   . Eczema Paternal Uncle   . Cancer Paternal Grandfather        skin  . Migraines Sister   . Multiple  sclerosis Other        paternal aunt; not entirely sure of diagnosis    Social History   Tobacco Use  . Smoking status: Passive Smoke Exposure - Never Smoker  . Smokeless tobacco: Never Used  . Tobacco comment: parents, but do not smoke in home  Substance Use Topics  . Alcohol use: No    Alcohol/week: 0.0 standard drinks  . Drug use: No    Home Medications Prior to Admission medications   Medication Sig Start Date End Date Taking? Authorizing Provider  EPINEPHrine (EPIPEN 2-PAK) 0.3 mg/0.3 mL IJ SOAJ injection Inject 0.3 mLs (0.3 mg total) into the muscle once. 05/09/16 09/11/19  Bobbitt, Heywood Iles, MD  escitalopram (LEXAPRO) 10 MG tablet Take 1 tablet (10 mg total) by mouth daily. 05/14/19   Lorre Munroe, NP  ferrous sulfate 325 (65 FE) MG tablet Take 1 tablet (325 mg total) by mouth daily with breakfast. 07/03/19   Deeann Saint, MD  fluticasone (FLONASE) 50 MCG/ACT nasal spray PLACE 2 SPRAYS INTO BOTH NOSTRILS DAILY AS NEEDED FOR ALLERGIES OR RHINITIS. 07/24/19   Lorre Munroe, NP  sodium fluoride (FLUORISHIELD) 1.1 % GEL dental gel  08/26/19   [provider]  SODIUM FLUORIDE 5000 PPM 1.1 % PSTE  08/26/19   [provider]  triamcinolone cream (KENALOG) 0.1 % Apply 1 application topically 2 (two) times daily. 06/28/19  Koberlein, Paris Lore, MD  Vitamin D, Ergocalciferol, (DRISDOL) 1.25 MG (50000 UNIT) CAPS capsule Take 1 capsule (50,000 Units total) by mouth every 7 (seven) days. 07/03/19   Deeann Saint, MD    Allergies    Dust mite extract, Latex, Nickel, Shellfish allergy, and Sulfa antibiotics  Review of Systems   Review of Systems  All other systems reviewed and are negative. Ten systems reviewed and are negative for acute change, except as noted in the HPI.   Physical Exam Updated Vital Signs BP 133/89 (BP Location: Right Arm)   Pulse (!) 114   Temp 98.6 F (37 C) (Oral)   Resp 17   SpO2 100%   Physical Exam Vitals and nursing note reviewed.   Constitutional:      General: She is not in acute distress.    Appearance: Normal appearance. She is obese. She is not ill-appearing, toxic-appearing or diaphoretic.  HENT:     Head: Normocephalic and atraumatic.     Right Ear: External ear normal.     Left Ear: External ear normal.     Nose: Nose normal.     Mouth/Throat:     Mouth: Mucous membranes are moist.     Pharynx: Oropharynx is clear. No oropharyngeal exudate or posterior oropharyngeal erythema.  Eyes:     General: No scleral icterus.       Right eye: No discharge.        Left eye: No discharge.     Extraocular Movements: Extraocular movements intact.     Conjunctiva/sclera: Conjunctivae normal.     Pupils: Pupils are equal, round, and reactive to light.  Cardiovascular:     Rate and Rhythm: Regular rhythm. Tachycardia present.     Pulses: Normal pulses.     Heart sounds: Normal heart sounds. No murmur. No friction rub. No gallop.   Pulmonary:     Effort: Pulmonary effort is normal. No respiratory distress.     Breath sounds: Normal breath sounds. No stridor. No wheezing, rhonchi or rales.  Abdominal:     General: Abdomen is flat.     Palpations: Abdomen is soft.     Tenderness: There is no abdominal tenderness. There is left CVA tenderness. There is no right CVA tenderness.     Comments: Mild suprapubic tenderness noted with deep palpation.  No rebound.  No guarding.  Nonsurgical abdomen.  Musculoskeletal:        General: Normal range of motion.     Cervical back: Normal range of motion.  Skin:    General: Skin is warm and dry.     Capillary Refill: Capillary refill takes less than 2 seconds.  Neurological:     General: No focal deficit present.     Mental Status: She is alert and oriented to person, place, and time.  Psychiatric:        Mood and Affect: Mood normal.        Behavior: Behavior normal.    ED Results / Procedures / Treatments   Labs (all labs ordered are listed, but only abnormal results are  displayed) Labs Reviewed  URINALYSIS, ROUTINE W REFLEX MICROSCOPIC - Abnormal; Notable for the following components:      Result Value   APPearance CLOUDY (*)    Hgb urine dipstick SMALL (*)    Protein, ur 30 (*)    Nitrite POSITIVE (*)    Leukocytes,Ua LARGE (*)    WBC, UA >50 (*)    Bacteria, UA MANY (*)  All other components within normal limits  BASIC METABOLIC PANEL - Abnormal; Notable for the following components:   Glucose, Bld 129 (*)    BUN <5 (*)    Calcium 8.7 (*)    All other components within normal limits  CBC WITH DIFFERENTIAL/PLATELET - Abnormal; Notable for the following components:   Hemoglobin 10.7 (*)    HCT 35.3 (*)    MCH 24.3 (*)    RDW 17.4 (*)    Neutro Abs 8.1 (*)    All other components within normal limits  I-STAT BETA HCG BLOOD, ED ([facility], [facility], [facility] ONLY)   EKG None  Radiology No results found.  Procedures Procedures   Medications Ordered in ED Medications - No data to display  ED Course  I have reviewed the triage vital signs and the nursing notes.  Pertinent labs & imaging results that were available during my care of the patient were reviewed by me and considered in my medical decision making (see chart for details).    MDM Rules/Calculators/A&P                       Please see physical exam and history above for further detail.  Patient presents today with signs and symptoms consistent of pyelonephritis.  Initial labs show mild hyperglycemia at 129 with mild hypocalcemia at 8.7.  Patient shows mild decreased hemoglobin at 10.7.  No leukocytosis.  Mild neutrophilia at 8.1.  UA shows small hemoglobin, proteinuria of 30, large leukocytes, greater than 50 white blood cells, many bacteria, positive nitrites.  Pregnancy test is negative.  She is allergic to sulfa drugs.  Based on the findings above as well as her symptoms we will treat with 7 days of ciprofloxacin.  I discussed this with the patient.  She does have a primary care provider.  I  recommended that she reach out to them in 2 days if her symptoms have not began to improve.  I recommended that she return to the emergency department if her symptoms worsen at all.  I recommended continued use of Tylenol and ibuprofen for management of her pain.  She understands she can return to the emergency department with any new or worsening symptoms.  Her questions were answered and she was amicable the time of discharge.  Her vital signs are stable.  Patient discharged to home/self care.  Condition at discharge: Stable  Note: Portions of this report may have been transcribed using voice recognition software. Every effort was made to ensure accuracy; however, inadvertent computerized transcription errors may be present.    Final Clinical Impression(s) / ED Diagnoses Final diagnoses:  Pyelonephritis   Rx / DC Orders ED Discharge Orders         Ordered    ciprofloxacin (CIPRO) 500 MG tablet  2 times daily     09/11/19 1243           Rayna Sexton, PA-C 09/11/19 1252    Tegeler, Gwenyth Allegra, MD 09/11/19 [phone]

## 2019-09-11 NOTE — ED Notes (Signed)
Patient verbalizes understanding of discharge instructions. Opportunity for questioning and answers were provided. Armband removed by staff, pt discharged from ED.  

## 2019-09-12 ENCOUNTER — Emergency Department (HOSPITAL_COMMUNITY)
Admission: EM | Admit: 2019-09-12 | Discharge: 2019-09-12 | Disposition: A | Discharge status: Home / Self Care | Payer: BC Managed Care – PPO | Attending: Emergency Medicine | Admitting: Emergency Medicine

## 2019-09-12 ENCOUNTER — Encounter (HOSPITAL_COMMUNITY): Payer: Self-pay

## 2019-09-12 ENCOUNTER — Ambulatory Visit: Payer: BC Managed Care – PPO | Admitting: Internal Medicine

## 2019-09-12 DIAGNOSIS — Z79899 Other long term (current) drug therapy: Secondary | ICD-10-CM | POA: Insufficient documentation

## 2019-09-12 DIAGNOSIS — R519 Headache, unspecified: Secondary | ICD-10-CM | POA: Diagnosis not present

## 2019-09-12 DIAGNOSIS — R202 Paresthesia of skin: Secondary | ICD-10-CM | POA: Diagnosis not present

## 2019-09-12 DIAGNOSIS — Z7722 Contact with and (suspected) exposure to environmental tobacco smoke (acute) (chronic): Secondary | ICD-10-CM | POA: Insufficient documentation

## 2019-09-12 DIAGNOSIS — Z91040 Latex allergy status: Secondary | ICD-10-CM | POA: Diagnosis not present

## 2019-09-12 LAB — BASIC METABOLIC PANEL
Anion gap: 11 (ref 5–15)
BUN: 7 mg/dL (ref 6–20)
CO2: 23 mmol/L (ref 22–32)
Calcium: 9 mg/dL (ref 8.9–10.3)
Chloride: 103 mmol/L (ref 98–111)
Creatinine, Ser: 0.85 mg/dL (ref 0.44–1.00)
GFR calc Af Amer: >60 mL/min (ref >60)
GFR calc non Af Amer: >60 mL/min (ref >60)
Glucose, Bld: 111 mg/dL — ABNORMAL HIGH (ref 70–99)
Potassium: 3.6 mmol/L (ref 3.5–5.1)
Sodium: 137 mmol/L (ref 135–145)

## 2019-09-12 LAB — CBC
HCT: 35.3 % — ABNORMAL LOW (ref 36.0–46.0)
Hemoglobin: 10.9 g/dL — ABNORMAL LOW (ref 12.0–15.0)
MCH: 24.8 pg — ABNORMAL LOW (ref 26.0–34.0)
MCHC: 30.9 g/dL (ref 30.0–36.0)
MCV: 80.4 fL (ref 80.0–100.0)
Platelets: 219 10*3/uL (ref 150–400)
RBC: 4.39 MIL/uL (ref 3.87–5.11)
RDW: 17.3 % — ABNORMAL HIGH (ref 11.5–15.5)
WBC: 8.4 10*3/uL (ref 4.0–10.5)
nRBC: 0 % (ref 0.0–0.2)

## 2019-09-12 MED ORDER — CEPHALEXIN 500 MG PO CAPS: 500.0000 mg | ORAL_CAPSULE | Freq: Four times a day (QID) | ORAL | 0 refills | Status: DC

## 2019-09-12 NOTE — ED Provider Notes (Addendum)
[facility] EMERGENCY DEPARTMENT Provider Note   CSN: [id number] Arrival date & time: 09/12/19  1446     History Chief Complaint  Patient presents with  . Headache    Jill Spencer is a 19 y.o. female presents to the ER for evaluation of "pins-and-needles" in bilateral palms and fingers and toes.  This began approximately 20 minutes after she took ciprofloxacin which she was prescribed for a UTI in the ER yesterday.  Pins and needle sensation lasted approximately 20 to 45 minutes and spontaneously resolved without any intervention.  She then developed a generalized mild headache.  She took ibuprofen and the headache resolved.  Currently is asymptomatic.  She called her primary care doctor and was told to come to the ER.  Patient is concerned she may be having an adverse reaction to ciprofloxacin.  She took 2 doses of this in the last 24 hours.  She denies any significant headache, double vision or loss of vision, unilateral loss of sensation or weakness.  Patient states she has had intermittent tingling and pins and needle sensation and her right hand for quite some time but states this episode of tingling was different and involved all her fingers and toes.  States that she has discussed these paresthesias with her doctor and currently trying to determine the cause.  Patient states her abdominal flank pain have resolved.  HPI     Past Medical History:  Diagnosis Date  . Eczema   . Recurrent urticaria 05/09/2016    Patient Active Problem List   Diagnosis Date Noted  . Acid reflux 06/20/2016  . Recurrent urticaria 05/09/2016  . Anxiety and depression 10/13/2014  . IBS (irritable bowel syndrome) 10/13/2014    History reviewed. No pertinent surgical history.   OB History   No obstetric history on file.     Family History  Problem Relation Age of Onset  . Hypertension Mother   . Hyperlipidemia Mother   . Cancer Maternal Grandfather        brain   .  Diabetes Paternal Grandmother   . Congestive Heart Failure Paternal Grandmother   . Hypertension Father   . Arthritis Father   . Eczema Paternal Uncle   . Cancer Paternal Grandfather        skin  . Migraines Sister   . Multiple sclerosis Other        paternal aunt; not entirely sure of diagnosis    Social History   Tobacco Use  . Smoking status: Passive Smoke Exposure - Never Smoker  . Smokeless tobacco: Never Used  . Tobacco comment: parents, but do not smoke in home  Substance Use Topics  . Alcohol use: No    Alcohol/week: 0.0 standard drinks  . Drug use: No    Home Medications Prior to Admission medications   Medication Sig Start Date End Date Taking? Authorizing Provider  cephALEXin (KEFLEX) 500 MG capsule Take 1 capsule (500 mg total) by mouth 4 (four) times daily. 09/12/19   Liberty Handy, PA-C  cetirizine (ZYRTEC) 10 MG tablet Take 10 mg by mouth daily as needed for allergies.    [provider]  EPINEPHrine (EPIPEN 2-PAK) 0.3 mg/0.3 mL IJ SOAJ injection Inject 0.3 mLs (0.3 mg total) into the muscle once. 05/09/16 09/11/19  Bobbitt, Heywood Iles, MD  escitalopram (LEXAPRO) 10 MG tablet Take 1 tablet (10 mg total) by mouth daily. 05/14/19   Lorre Munroe, NP  ferrous sulfate 325 (65 FE) MG tablet Take  1 tablet (325 mg total) by mouth daily with breakfast. Patient taking differently: Take 325 mg by mouth every evening.  07/03/19   Deeann Saint, MD  fluticasone (FLONASE) 50 MCG/ACT nasal spray PLACE 2 SPRAYS INTO BOTH NOSTRILS DAILY AS NEEDED FOR ALLERGIES OR RHINITIS. 07/24/19   Lorre Munroe, NP  SODIUM FLUORIDE 5000 PPM 1.1 % PSTE Place 1 application onto teeth every evening.  08/26/19   [provider]  triamcinolone cream (KENALOG) 0.1 % Apply 1 application topically 2 (two) times daily. Patient taking differently: Apply 1-2 application topically 2 (two) times daily as needed (eczema flare ups).  06/28/19   Wynn Banker, MD  Vitamin D,  Ergocalciferol, (DRISDOL) 1.25 MG (50000 UNIT) CAPS capsule Take 1 capsule (50,000 Units total) by mouth every 7 (seven) days. Patient taking differently: Take 50,000 Units by mouth every Wednesday.  07/03/19   Deeann Saint, MD    Allergies    Dust mite extract, Latex, Nickel, Shellfish allergy, and Sulfa antibiotics  Review of Systems   Review of Systems  Neurological: Positive for headaches.       Paresthesias, resolved   All other systems reviewed and are negative.   Physical Exam Updated Vital Signs BP 136/82   Pulse 89   Temp 98.6 F (37 C) (Oral)   Resp 16   Ht 5\' 6"  (1.676 m)   Wt 98.9 kg   SpO2 96%   BMI 35.19 kg/m   Physical Exam Constitutional:      Appearance: She is well-developed.  HENT:     Head: Normocephalic.     Nose: Nose normal.  Eyes:     General: Lids are normal.  Cardiovascular:     Rate and Rhythm: Normal rate.  Pulmonary:     Effort: Pulmonary effort is normal. No respiratory distress.  Musculoskeletal:        General: Normal range of motion.     Cervical back: Normal range of motion.  Neurological:     Mental Status: She is alert.     GCS: GCS eye subscore is 4. GCS verbal subscore is 5. GCS motor subscore is 6.     Cranial Nerves: No cranial nerve deficit or facial asymmetry.     Sensory: No sensory deficit.     Motor: No weakness.  Psychiatric:        Behavior: Behavior normal.     ED Results / Procedures / Treatments   Labs (all labs ordered are listed, but only abnormal results are displayed) Labs Reviewed  CBC - Abnormal; Notable for the following components:      Result Value   Hemoglobin 10.9 (*)    HCT 35.3 (*)    MCH 24.8 (*)    RDW 17.3 (*)    All other components within normal limits  BASIC METABOLIC PANEL - Abnormal; Notable for the following components:   Glucose, Bld 111 (*)    All other components within normal limits    EKG None  Radiology No results found.  Procedures Procedures (including  critical care time)  Medications Ordered in ED Medications - No data to display  ED Course  I have reviewed the triage vital signs and the nursing notes.  Pertinent labs & imaging results that were available during my care of the patient were reviewed by me and considered in my medical decision making (see chart for details).    MDM Rules/Calculators/A&P  19 year old female presents with paresthesias in her fingertips, palms and toes 20 minutes after taking ciprofloxacin.  Paresthesias lasted approximately 20 to 45 minutes and spontaneously resolved.  Paresthesias have not returned.  She had a mild headache that also resolved after ibuprofen.  She is concerned she is having an adverse reaction to the ciprofloxacin she was prescribed for pyelonephritis in the ER yesterday.  Patient is currently asymptomatic.  She has no neuro focal deficits.  Sensation, strength intact in upper and lower extremities.  No longer having headache.  Patient actually states that she has had intermittent pins-and-needles and tingling sensation in her right hand for quite some time currently being addressed by outside provider.  ER lab work initiated in triage, personally reviewed and normal. Electrolytes normal. I considered acute life-threatening process like intracranial bleed, anaphylaxis very unlikely given her age, transient symptoms and now normal exam.  We will switch ciprofloxacin to Keflex.  Return precautions discussed.  Patient is comfortable with this plan. Final Clinical Impression(s) / ED Diagnoses Final diagnoses:  Paresthesias    Rx / DC Orders ED Discharge Orders         Ordered    cephALEXin (KEFLEX) 500 MG capsule  4 times daily     09/12/19 1806             Arlean Hopping 09/12/19 1807    Malvin Johns, MD 09/12/19 2322

## 2019-09-12 NOTE — ED Triage Notes (Addendum)
Pt arrives to ED w/ c/o numbness and tingling in her fingers and 3/10 headache. Pt seen here yesterday and prescribed abx states that she feels this is a reaction to the abx.

## 2019-09-12 NOTE — Discharge Instructions (Addendum)
You were seen in the ER for tingling and pins and needle sensation in your hands, toes with mild headache  The cause of your symptoms is unclear.  Your lab work is normal today.  It is possible you could have had an adverse reaction to ciprofloxacin but unable to determine this 100%.  We will switch antibiotic to cephalexin.  Return to the ER for sudden or severe headache, visual disturbances, any stroke symptoms, facial tongue or throat swelling, difficulty breathing or severe rash

## 2019-09-19 ENCOUNTER — Other Ambulatory Visit: Payer: Self-pay

## 2019-09-19 ENCOUNTER — Ambulatory Visit (INDEPENDENT_AMBULATORY_CARE_PROVIDER_SITE_OTHER): Payer: BC Managed Care – PPO | Admitting: Rehabilitative and Restorative Service Providers"

## 2019-09-19 ENCOUNTER — Encounter: Payer: Self-pay | Admitting: Rehabilitative and Restorative Service Providers"

## 2019-09-19 DIAGNOSIS — M79604 Pain in right leg: Secondary | ICD-10-CM | POA: Diagnosis not present

## 2019-09-19 DIAGNOSIS — M25541 Pain in joints of right hand: Secondary | ICD-10-CM

## 2019-09-19 DIAGNOSIS — R262 Difficulty in walking, not elsewhere classified: Secondary | ICD-10-CM

## 2019-09-19 DIAGNOSIS — M25542 Pain in joints of left hand: Secondary | ICD-10-CM | POA: Diagnosis not present

## 2019-09-19 DIAGNOSIS — M79605 Pain in left leg: Secondary | ICD-10-CM | POA: Diagnosis not present

## 2019-09-19 DIAGNOSIS — M6281 Muscle weakness (generalized): Secondary | ICD-10-CM

## 2019-09-19 NOTE — Therapy (Signed)
[facility] [address] [place], [place], [address] Phone: [phone]   Fax:  [phone]  Physical Therapy Evaluation  Patient Details  Name: Jill Spencer MRN: [id number] Date of Birth: 2001/01/03 Referring Provider (PT): Dr. Corliss Skains   Encounter Date: 09/19/2019  PT End of Session - 09/19/19 1014    Visit Number  1    Number of Visits  12    Date for PT Re-Evaluation  10/17/19    Authorization Time Period  cert 7/91/5056 - 10/31/2019    PT Start Time  1015    PT Stop Time  1058    PT Time Calculation (min)  43 min    Activity Tolerance  Patient tolerated treatment well    Behavior During Therapy  [facility] for tasks assessed/performed       Past Medical History:  Diagnosis Date  . Eczema   . Recurrent urticaria 05/09/2016    History reviewed. No pertinent surgical history.  There were no vitals filed for this visit.   Subjective Assessment - 09/19/19 1021    Subjective  Pt. indicated having pain everywhere all the time.  Pt. stated she isn't sure what to do.  Pt. indicated primary areas of pain in hands and hip/knees.  Pt. stated equal bilaterally.   Pt. indicated insidious onset about 4 years ago with some MD visits along.  Pt. stated no specific treatment performed until this point.    Diagnostic tests  none reported    Currently in Pain?  Yes    Pain Score  5    at worst 5/10, at best 0/10   Pain Location  Knee   hip/knees   Pain Orientation  Left;Right    Pain Descriptors / Indicators  Aching;Stabbing    Pain Type  Chronic pain    Pain Onset  More than a month ago    Pain Frequency  Intermittent    Aggravating Factors   standing, walking    Pain Relieving Factors  rest    Multiple Pain Sites  Yes    Pain Score  4   at worst 4/10   Pain Location  Hand   all finger joints   Pain Orientation  Right;Left    Pain Descriptors / Indicators  Aching    Pain Type  Chronic pain    Pain Onset  More than a month ago    Pain  Frequency  Constant    Aggravating Factors   playing video games    Pain Relieving Factors  rest         [facility] PT Assessment - 09/19/19 0001      Assessment   Medical Diagnosis  Hypermobility arthralgia    Referring Provider (PT)  Dr. Corliss Skains    Onset Date/Surgical Date  05/22/15    Hand Dominance  Right      Precautions   Precautions  None      Restrictions   Weight Bearing Restrictions  No      Balance Screen   Has the patient fallen in the past 6 months  No    Is the patient reluctant to leave their home because of a fear of falling?   No      Home Environment   Living Environment  Private residence    Type of Home  House    Home Layout  Two level      Prior Function   Level of Independence  Independent    Leisure  artist,  video games      Cognition   Overall Cognitive Status  Within Functional Limits for tasks assessed      Functional Tests   Functional tests  Single leg stance      Single Leg Stance   Comments  L SLS, R SLS 10 seconds c aberrant movement noted throughout      ROM / Strength   AROM / PROM / Strength  Strength;PROM;AROM      AROM   Overall AROM Comments  Gross MCP extension each digit 80-90 deg.  Knee flexion WFL bilateral, elbow flexion WFL bilateral    AROM Assessment Site  Forearm;Wrist;Finger;Hip;Knee;Elbow    Right/Left Elbow  Left;Right    Right Elbow Extension  8    Left Elbow Extension  10    Right/Left Finger  Right    Left Composite Finger Extension  --    Right/Left Knee  Left;Right    Right Knee Extension  5    Left Knee Extension  5      Strength   Strength Assessment Site  Wrist;Forearm;Hand;Hip;Knee;Ankle    Right/Left Forearm  Left;Right    Right Forearm Pronation  5/5    Right Forearm Supination  5/5    Left Forearm Pronation  5/5    Left Forearm Supination  5/5    Right/Left Wrist  Left;Right    Right Wrist Flexion  5/5    Right Wrist Extension  5/5    Left Wrist Flexion  5/5    Left Wrist Extension  5/5     Right/Left hand  Left;Right    Right Hand Grip (lbs)  30.3    Left Hand Grip (lbs)  53    Right/Left Hip  Left;Right    Right Hip Flexion  5/5    Right Hip Extension  4+/5    Right Hip ABduction  4+/5    Left Hip Flexion  5/5    Left Hip Extension  4+/5    Left Hip ABduction  4+/5    Right/Left Knee  Left;Right    Right Knee Flexion  5/5    Right Knee Extension  5/5    Left Knee Flexion  5/5    Right/Left Ankle  Left;Right    Right Ankle Dorsiflexion  5/5    Left Ankle Dorsiflexion  5/5      Palpation   Palpation comment  No specific tenderness to touch      Special Tests   Other special tests  Beighton Score 7/9      Ambulation/Gait   Ambulation/Gait  Yes    Ambulation/Gait Assistance  7: Independent    Gait Comments  Hyperextension in stance bilateral mild                  Objective measurements completed on examination: See above findings.      [facility] Adult PT Treatment/Exercise - 09/19/19 0001      Exercises   Exercises  Knee/Hip;Hand      Knee/Hip Exercises: Supine   Other Supine Knee/Hip Exercises  supine slr 3 x 10, bridge 3 x 10 for HEP      Hand Exercises   Other Hand Exercises  finger extension c rubber band, thumb resisted opposition    Other Hand Exercises  grip squeeze on towel 5 sec x 10             PT Education - 09/19/19 1013    Education Details  HEP, POC    Person(s)  Educated  Patient    Methods  Explanation;Demonstration;Verbal cues;Handout    Comprehension  Returned demonstration;Verbalized understanding;Verbal cues required          PT Long Term Goals - 09/19/19 1014      PT LONG TERM GOAL #1   Title  Patient will demonstrate/report pain at worst less than or equal to 2/10 to facilitate minimal limitation in daily activity secondary to pain symptoms.    Time  6    Period  Weeks    Status  New    Target Date  10/31/19      PT LONG TERM GOAL #2   Title  Patient will demonstrate independent use of home exercise  program to facilitate ability to maintain/progress functional gains from skilled physical therapy services.    Time  6    Period  Weeks    Status  New    Target Date  10/31/19      PT LONG TERM GOAL #3   Title  Pt. will demonstrate Bilateral LE MMT 5/5 throughout to facilitate usual standing, walking at PLOF s limitation.    Time  6    Period  Weeks    Status  New    Target Date  10/31/19      PT LONG TERM GOAL #4   Title  Pt. will demonstrate bilateral grip strength > 50 lbs s pain symptoms for daily grasp, hand use in work and recreational activity.    Time  6    Period  Weeks    Status  New    Target Date  10/31/19      PT LONG TERM GOAL #5   Title  Pt. will demonstrate/report ability to perform usual recreational gaming, art activity at PLOF.    Time  6    Period  Weeks    Status  New    Target Date  10/31/19             Plan - 09/19/19 1015    Clinical Impression Statement  Patient is a 19 y.o. female who comes to clinic with complaints of generalized pain, specifically bilateral hand pain, leg pain with strength and movement coordination deficits that impair her ability to perform usual daily and recreational functional activities without increase difficulty/symptoms at this time.  Hypermobility documented in multiple joints. Patient to benefit from skilled PT services to address impairments and limitations to improve to previous level of function without restriction secondary to condition.    Personal Factors and Comorbidities  Other;Time since onset of injury/illness/exacerbation   acid reflux, recurrent urticaria, anxiety, depression, IBS   Examination-Activity Limitations  Locomotion Level;Lift;Other   write, grasp   Examination-Participation Restrictions  School;Other   gaming, artistry   Stability/Clinical Decision Making  Evolving/Moderate complexity    Clinical Decision Making  Moderate    Rehab Potential  Good    PT Frequency  2x / week    PT Duration  6  weeks    PT Treatment/Interventions  ADLs/Self Care Home Management;Electrical Stimulation;Iontophoresis 4mg /ml Dexamethasone;Moist Heat;Traction;Balance training;Therapeutic exercise;Therapeutic activities;Functional mobility training;Stair training;Gait training;Ultrasound;Neuromuscular re-education;Patient/family education;Manual techniques;Taping;Dry needling;Passive range of motion;Joint Manipulations;Spinal Manipulations    PT Next Visit Plan  Possible low grade joint mobiilization for pain relief, strengthening/movement coordination improvements, generalized aerobic.  Differential dx possible for EDS?    PT Home Exercise Plan     Consulted and Agree with Plan of Care  Patient       Patient will benefit from skilled therapeutic  intervention in order to improve the following deficits and impairments:  Hypermobility, Postural dysfunction, Pain, Abnormal gait, Decreased endurance, Decreased activity tolerance, Decreased strength, Impaired UE functional use, Decreased balance, Difficulty walking, Decreased coordination  Visit Diagnosis: Pain in joints of left hand  Pain in joints of right hand  Pain in left leg  Pain in right leg  Muscle weakness (generalized)  Difficulty in walking, not elsewhere classified     Problem List Patient Active Problem List   Diagnosis Date Noted  . Acid reflux 06/20/2016  . Recurrent urticaria 05/09/2016  . Anxiety and depression 10/13/2014  . IBS (irritable bowel syndrome) 10/13/2014   Scot Jun, PT, DPT, OCS, ATC 09/19/19  2:40 PM    [facility] [address] [place], [place], [address] Phone: [phone]   Fax:  [phone]  Name: JAHNE KRUKOWSKI MRN: [id number] Date of Birth: 2000/12/20

## 2019-09-24 ENCOUNTER — Other Ambulatory Visit: Payer: Self-pay | Admitting: Family Medicine

## 2019-09-24 NOTE — Telephone Encounter (Signed)
Dr. Koberlein pt 

## 2019-09-26 ENCOUNTER — Ambulatory Visit: Payer: BC Managed Care – PPO | Admitting: Psychology

## 2019-09-29 NOTE — Progress Notes (Signed)
Office Visit Note  Patient: Jill Spencer             Date of Birth: [birth year]           MRN: [id number]             PCP: Caren Macadam, MD Referring: Jearld Fenton, NP Visit Date: 10/08/2019 Occupation: _0 @  Subjective:  Pain in multiple joints and muscles  History of Present Illness: Jill Spencer is a 19 y.o. female with history of pain in multiple joints and low titer ANA.  She states she continues to have pain and discomfort in her right shoulder, right elbow and right leg which she describes over the right hip, right knee and right ankle joint.  She has not noticed any joint swelling.  She continues to have some discomfort in her joints due to hypermobility.  She continues to have hives from her allergies.  She has been taking vitamin D for vitamin D deficiency.  She has not noticed any improvement so far.  Activities of Daily Living:  Patient reports morning stiffness for 0 minutes.   Patient Reports nocturnal pain.  Difficulty dressing/grooming: Denies Difficulty climbing stairs: Reports Difficulty getting out of chair: Reports Difficulty using hands for taps, buttons, cutlery, and/or writing: Denies  Review of Systems  Constitutional: Positive for fatigue. Negative for night sweats, weight gain and weight loss.  HENT: Negative for mouth sores, trouble swallowing, trouble swallowing, mouth dryness and nose dryness.   Eyes: Positive for dryness. Negative for pain, redness, itching and visual disturbance.  Respiratory: Negative for cough, shortness of breath and difficulty breathing.   Cardiovascular: Negative for chest pain, palpitations, hypertension, irregular heartbeat and swelling in legs/feet.  Gastrointestinal: Negative for blood in stool, constipation and diarrhea.  Endocrine: Negative for increased urination.  Genitourinary: Negative for difficulty urinating and vaginal dryness.  Musculoskeletal: Positive for arthralgias, joint pain, myalgias,  muscle tenderness and myalgias. Negative for joint swelling, muscle weakness and morning stiffness.  Skin: Positive for rash. Negative for color change, hair loss, redness, skin tightness, ulcers and sensitivity to sunlight.       hives  Allergic/Immunologic: Negative for susceptible to infections.  Neurological: Positive for headaches and weakness. Negative for dizziness, numbness, memory loss and night sweats.  Hematological: Negative for bruising/bleeding tendency and swollen glands.  Psychiatric/Behavioral: Positive for depressed mood and sleep disturbance. Negative for confusion. The patient is not nervous/anxious.     PMFS History:  Patient Active Problem List   Diagnosis Date Noted  . Acid reflux 06/20/2016  . Recurrent urticaria 05/09/2016  . Anxiety and depression 10/13/2014  . IBS (irritable bowel syndrome) 10/13/2014    Past Medical History:  Diagnosis Date  . ADHD    per patient   . Eczema   . Recurrent urticaria 05/09/2016    Family History  Problem Relation Age of Onset  . Hypertension Mother   . Hyperlipidemia Mother   . Cancer Maternal Grandfather        brain   . Diabetes Paternal Grandmother   . Congestive Heart Failure Paternal Grandmother   . Hypertension Father   . Arthritis Father   . Eczema Paternal Uncle   . Cancer Paternal Grandfather        skin, blood  . Migraines Sister   . Multiple sclerosis Other        paternal aunt; not entirely sure of diagnosis   History reviewed. No pertinent surgical history. Social History   Social History  Narrative  . Not on file   Immunization History  Administered Date(s) Administered  . DTaP 09/21/1999, 11/22/1999, 01/24/2000, 01/19/2001, 08/05/2004  . Hepatitis B April 03, 2000, 08/26/1999, 05/09/2000  . HiB (PRP-OMP) 09/21/1999, 11/22/1999, 01/24/2000, 10/20/2000  . IPV 09/21/1999, 11/22/1999, 01/19/2002, 08/05/2004  . Influenza,inj,Quad PF,6+ Mos 03/12/2015  . MMR 10/20/2000, 08/05/2004  . Meningococcal  Conjugate 01/13/2010  . Meningococcal Polysaccharide 03/15/2016  . Pneumococcal Conjugate-13 09/21/1999, 11/22/1999, 01/25/2000, 06/30/2000  . Tdap 01/13/2010  . Varicella 10/20/2000     Objective: Vital Signs: BP 140/90 (BP Location: Right Arm, Patient Position: Sitting, Cuff Size: Normal)   Pulse (!) 105   Resp 14   Ht _0  (1.676 m)   Wt 224 lb 3.2 oz (101.7 kg)   BMI 36.19 kg/m    Physical Exam Vitals and nursing note reviewed.  Constitutional:      Appearance: She is well-developed.  HENT:     Head: Normocephalic and atraumatic.  Eyes:     Conjunctiva/sclera: Conjunctivae normal.  Cardiovascular:     Rate and Rhythm: Normal rate and regular rhythm.     Heart sounds: Normal heart sounds.  Pulmonary:     Effort: Pulmonary effort is normal.     Breath sounds: Normal breath sounds.  Abdominal:     General: Bowel sounds are normal.     Palpations: Abdomen is soft.  Musculoskeletal:     Cervical back: Normal range of motion.  Lymphadenopathy:     Cervical: No cervical adenopathy.  Skin:    General: Skin is warm and dry.     Capillary Refill: Capillary refill takes less than 2 seconds.  Neurological:     Mental Status: She is alert and oriented to person, place, and time.  Psychiatric:        Behavior: Behavior normal.      Musculoskeletal Exam: C-spine thoracic and lumbar spine were in good range of motion with no tenderness.  Shoulder joints, elbow joints, wrist joints, MCPs PIPs and DIPs with good range of motion with no synovitis.  She has hypermobility in most of her joints.  Hip joints, knee joints, ankles, MTPs and PIPs with good range of motion with no synovitis.  She has hypermobility.  CDAI Exam: CDAI Score: -- Patient Global: --; Provider Global: -- Swollen: --; Tender: -- Joint Exam 10/08/2019   No joint exam has been documented for this visit   There is currently no information documented on the homunculus. Go to the Rheumatology activity and  complete the homunculus joint exam.  Investigation: No additional findings.  Imaging: No results found.  Recent Labs: Lab Results  Component Value Date   WBC 8.4 09/12/2019   HGB 10.9 (L) 09/12/2019   PLT 219 09/12/2019   NA 137 09/12/2019   K 3.6 09/12/2019   CL 103 09/12/2019   CO2 23 09/12/2019   GLUCOSE 111 (H) 09/12/2019   BUN 7 09/12/2019   CREATININE 0.85 09/12/2019   BILITOT 0.3 05/14/2019   ALKPHOS 79 05/14/2019   AST 16 05/14/2019   ALT 14 05/14/2019   PROT 7.6 05/14/2019   ALBUMIN 4.1 05/14/2019   CALCIUM 9.0 09/12/2019   GFRAA >60 09/12/2019   May 19, 2021AVISE labs-lupus index Speciality Comments: No specialty comments available.  Procedures:  No procedures performed Allergies: Dust mite extract, Latex, Nickel, Shellfish allergy, and Sulfa antibiotics   Assessment / Plan:     Visit Diagnoses: Positive ANA (antinuclear antibody) - ANA was 1: 40 speckled.AVISE -patient complains of fatigue and hives.  Will obtain following labs today.  Plan: ANA, Anti-scleroderma antibody, RNP Antibody, Anti-Smith antibody, Sjogrens syndrome-A extractable nuclear antibody, Sjogrens syndrome-B extractable nuclear antibody, C3 and C4, Beta-2 glycoprotein antibodies, Cardiolipin antibodies, IgG, IgM, IgA, Lupus Anticoagulant Eval w/Reflex  Hypermobility arthralgia - Patient was referred to physical therapy for isometric exercises.  Patient has been going to physical therapy and getting some benefit.  She would also like to be referred for evaluation of hypermobility.  She continues to have discomfort in multiple joints including her hips and knees and ankles.  No synovitis was noted.  Chronic right shoulder pain -she continues to have discomfort in her right shoulder joint.  It was in full range of motion.  Plan: Cyclic citrul peptide antibody, IgG, 14-3-3 eta Protein, Sedimentation rate.  Recurrent urticaria - Patient had evaluation by allergist in the past.  She has been taking  over-the-counter medications.  Vitamin D deficiency - June 28, 2019 vitamin D 9.  Patient is on vitamin D supplement.  She states she will be getting her repeat labs this month on 21st.  She has not noticed any improvement in her fatigue on vitamin D.  Gastroesophageal reflux disease without esophagitis  Irritable bowel syndrome with both constipation and diarrhea - History of constipation and diarrhea.  Anxiety and depression  Other fatigue - Plan: CK  Orders: Orders Placed This Encounter  Procedures  . ANA  . Anti-scleroderma antibody  . RNP Antibody  . Anti-Smith antibody  . Sjogrens syndrome-A extractable nuclear antibody  . Sjogrens syndrome-B extractable nuclear antibody  . C3 and C4  . Beta-2 glycoprotein antibodies  . Cardiolipin antibodies, IgG, IgM, IgA  . Lupus Anticoagulant Eval w/Reflex  . Cyclic citrul peptide antibody, IgG  . 14-3-3 eta Protein  . Sedimentation rate  . CK   No orders of the defined types were placed in this encounter.    Follow-Up Instructions: Return for ANA, hypermobility arthralgia.   Bo Merino, MD  Note - This record has been created using Editor, commissioning.  Chart creation errors have been sought, but may not always  have been located. Such creation errors do not reflect on  the standard of medical care.

## 2019-09-30 ENCOUNTER — Encounter: Payer: BC Managed Care – PPO | Admitting: Rehabilitative and Restorative Service Providers"

## 2019-10-01 DIAGNOSIS — F331 Major depressive disorder, recurrent, moderate: Secondary | ICD-10-CM | POA: Diagnosis not present

## 2019-10-01 DIAGNOSIS — F411 Generalized anxiety disorder: Secondary | ICD-10-CM | POA: Diagnosis not present

## 2019-10-01 DIAGNOSIS — F900 Attention-deficit hyperactivity disorder, predominantly inattentive type: Secondary | ICD-10-CM | POA: Diagnosis not present

## 2019-10-04 ENCOUNTER — Ambulatory Visit: Payer: BC Managed Care – PPO | Admitting: Rehabilitative and Restorative Service Providers"

## 2019-10-04 ENCOUNTER — Other Ambulatory Visit: Payer: Self-pay

## 2019-10-04 ENCOUNTER — Encounter: Payer: Self-pay | Admitting: Rehabilitative and Restorative Service Providers"

## 2019-10-04 DIAGNOSIS — M79605 Pain in left leg: Secondary | ICD-10-CM

## 2019-10-04 DIAGNOSIS — M6281 Muscle weakness (generalized): Secondary | ICD-10-CM

## 2019-10-04 DIAGNOSIS — M79604 Pain in right leg: Secondary | ICD-10-CM | POA: Diagnosis not present

## 2019-10-04 DIAGNOSIS — M25541 Pain in joints of right hand: Secondary | ICD-10-CM | POA: Diagnosis not present

## 2019-10-04 DIAGNOSIS — M25542 Pain in joints of left hand: Secondary | ICD-10-CM | POA: Diagnosis not present

## 2019-10-04 DIAGNOSIS — R262 Difficulty in walking, not elsewhere classified: Secondary | ICD-10-CM

## 2019-10-04 NOTE — Therapy (Signed)
[facility] Physical Therapy [address] [place], [place], [address] Phone: [phone]   Fax:  [phone]  Physical Therapy Treatment  Patient Details  Name: Jill Spencer MRN: [id number] Date of Birth: December 15, 2000 Referring Provider (PT): Dr. Corliss Skains   Encounter Date: 10/04/2019   PT End of Session - 10/04/19 1001    Visit Number 2    Number of Visits 12    Date for PT Re-Evaluation 10/17/19    Authorization Time Period cert 0/12/2328 - 10/31/2019    PT Start Time 1003    PT Stop Time 1043    PT Time Calculation (min) 40 min    Activity Tolerance Patient tolerated treatment well    Behavior During Therapy [facility] for tasks assessed/performed           Past Medical History:  Diagnosis Date  . Eczema   . Recurrent urticaria 05/09/2016    History reviewed. No pertinent surgical history.  There were no vitals filed for this visit.   Subjective Assessment - 10/04/19 1014    Subjective Pt. indicated some hip complaints c leg raises but overall felt pretty good in knees in last few days.  Hands hurt some more with increased work activity but overall not severe.    Diagnostic tests none reported    Currently in Pain? No/denies    Pain Score 0-No pain    Pain Location Knee    Pain Orientation Right;Left    Pain Onset More than a month ago    Aggravating Factors  standing/walking prolonged    Pain Score 0    Pain Location Hand    Pain Orientation Right;Left    Pain Onset More than a month ago    Aggravating Factors  increased work time                             [facility] Adult PT Treatment/Exercise - 10/04/19 0001      Knee/Hip Exercises: Aerobic   Nustep lvl 5 10 mins      Knee/Hip Exercises: Standing   Other Standing Knee Exercises wall sit to fatigue x 2      Knee/Hip Exercises: Supine   Other Supine Knee/Hip Exercises supine slr x 10 bilateral, supine bridge x 10     Other Supine Knee/Hip Exercises supine ball squeeze 5  seconds x 10      Knee/Hip Exercises: Sidelying   Hip ABduction Both;10 reps    Clams 10 bilat      Knee/Hip Exercises: Prone   Other Prone Exercises prone plank on knees to fatigue x2      Hand Exercises   Other Hand Exercises review of grip squeeze                       PT Long Term Goals - 09/19/19 1014      PT LONG TERM GOAL #1   Title Patient will demonstrate/report pain at worst less than or equal to 2/10 to facilitate minimal limitation in daily activity secondary to pain symptoms.    Time 6    Period Weeks    Status New    Target Date 10/31/19      PT LONG TERM GOAL #2   Title Patient will demonstrate independent use of home exercise program to facilitate ability to maintain/progress functional gains from skilled physical therapy services.    Time 6    Period Weeks  Status New    Target Date 10/31/19      PT LONG TERM GOAL #3   Title Pt. will demonstrate Bilateral LE MMT 5/5 throughout to facilitate usual standing, walking at PLOF s limitation.    Time 6    Period Weeks    Status New    Target Date 10/31/19      PT LONG TERM GOAL #4   Title Pt. will demonstrate bilateral grip strength > 50 lbs s pain symptoms for daily grasp, hand use in work and recreational activity.    Time 6    Period Weeks    Status New    Target Date 10/31/19      PT LONG TERM GOAL #5   Title Pt. will demonstrate/report ability to perform usual recreational gaming, art activity at PLOF.    Time 6    Period Weeks    Status New    Target Date 10/31/19                 Plan - 10/04/19 1017    Clinical Impression Statement Indications of some possible improvements in progressive mobility tolerance reported.  Pt. is appropriate for setting up HEP based off possible EDS involvement.  Package of strength training exercises to improve core and LE/UE strength and control provided for more long term gains.    Personal Factors and Comorbidities Other;Time since onset of  injury/illness/exacerbation   acid reflux, recurrent urticaria, anxiety, depression, IBS   Examination-Activity Limitations Locomotion Level;Lift;Other   write, grasp   Examination-Participation Restrictions School;Other   gaming, artistry   Stability/Clinical Decision Making Evolving/Moderate complexity    Rehab Potential Good    PT Frequency 2x / week    PT Duration 6 weeks    PT Treatment/Interventions ADLs/Self Care Home Management;Electrical Stimulation;Iontophoresis 4mg /ml Dexamethasone;Moist Heat;Traction;Balance training;Therapeutic exercise;Therapeutic activities;Functional mobility training;Stair training;Gait training;Ultrasound;Neuromuscular re-education;Patient/family education;Manual techniques;Taping;Dry needling;Passive range of motion;Joint Manipulations;Spinal Manipulations    PT Next Visit Plan Recommend use of HEP provided with continued investigation into EDS from other medical professionals.  Return to clinic in 2- 4 weeks for follow up on HEP plan.    PT Home Exercise Plan [id number]    Consulted and Agree with Plan of Care Patient           Patient will benefit from skilled therapeutic intervention in order to improve the following deficits and impairments:  Hypermobility, Postural dysfunction, Pain, Abnormal gait, Decreased endurance, Decreased activity tolerance, Decreased strength, Impaired UE functional use, Decreased balance, Difficulty walking, Decreased coordination  Visit Diagnosis: Pain in joints of left hand  Pain in joints of right hand  Pain in left leg  Pain in right leg  Muscle weakness (generalized)  Difficulty in walking, not elsewhere classified     Problem List Patient Active Problem List   Diagnosis Date Noted  . Acid reflux 06/20/2016  . Recurrent urticaria 05/09/2016  . Anxiety and depression 10/13/2014  . IBS (irritable bowel syndrome) 10/13/2014   Scot Jun, PT, DPT, OCS, ATC 10/04/19  10:48 AM    [facility]  Physical Therapy [address] [place], [place], [address] Phone: [phone]   Fax:  [phone]  Name: Jill Spencer MRN: [id number] Date of Birth: Jan 18, 2001

## 2019-10-08 ENCOUNTER — Ambulatory Visit: Payer: BC Managed Care – PPO | Admitting: Rheumatology

## 2019-10-08 ENCOUNTER — Encounter: Payer: Self-pay | Admitting: Rheumatology

## 2019-10-08 ENCOUNTER — Encounter: Payer: BC Managed Care – PPO | Admitting: Rehabilitative and Restorative Service Providers"

## 2019-10-08 ENCOUNTER — Other Ambulatory Visit: Payer: Self-pay | Admitting: *Deleted

## 2019-10-08 ENCOUNTER — Other Ambulatory Visit: Payer: Self-pay

## 2019-10-08 VITALS — BP 140/90 | HR 105 | Resp 14 | Ht 66.0 in | Wt 224.2 lb

## 2019-10-08 DIAGNOSIS — F329 Major depressive disorder, single episode, unspecified: Secondary | ICD-10-CM

## 2019-10-08 DIAGNOSIS — K582 Mixed irritable bowel syndrome: Secondary | ICD-10-CM

## 2019-10-08 DIAGNOSIS — K219 Gastro-esophageal reflux disease without esophagitis: Secondary | ICD-10-CM

## 2019-10-08 DIAGNOSIS — M25511 Pain in right shoulder: Secondary | ICD-10-CM

## 2019-10-08 DIAGNOSIS — M2550 Pain in unspecified joint: Secondary | ICD-10-CM | POA: Diagnosis not present

## 2019-10-08 DIAGNOSIS — L500 Allergic urticaria: Secondary | ICD-10-CM

## 2019-10-08 DIAGNOSIS — R768 Other specified abnormal immunological findings in serum: Secondary | ICD-10-CM

## 2019-10-08 DIAGNOSIS — F419 Anxiety disorder, unspecified: Secondary | ICD-10-CM

## 2019-10-08 DIAGNOSIS — G8929 Other chronic pain: Secondary | ICD-10-CM

## 2019-10-08 DIAGNOSIS — E559 Vitamin D deficiency, unspecified: Secondary | ICD-10-CM

## 2019-10-08 DIAGNOSIS — R5383 Other fatigue: Secondary | ICD-10-CM

## 2019-10-08 DIAGNOSIS — M357 Hypermobility syndrome: Secondary | ICD-10-CM

## 2019-10-10 ENCOUNTER — Encounter: Payer: BC Managed Care – PPO | Admitting: Rehabilitative and Restorative Service Providers"

## 2019-10-11 ENCOUNTER — Other Ambulatory Visit: Payer: Self-pay

## 2019-10-14 ENCOUNTER — Other Ambulatory Visit: Payer: Self-pay

## 2019-10-14 ENCOUNTER — Ambulatory Visit: Payer: BC Managed Care – PPO | Admitting: Family Medicine

## 2019-10-14 ENCOUNTER — Encounter: Payer: Self-pay | Admitting: Family Medicine

## 2019-10-14 VITALS — BP 118/90 | HR 98 | Temp 98.1°F | Wt 223.3 lb

## 2019-10-14 DIAGNOSIS — T7800XA Anaphylactic reaction due to unspecified food, initial encounter: Secondary | ICD-10-CM | POA: Diagnosis not present

## 2019-10-14 DIAGNOSIS — F419 Anxiety disorder, unspecified: Secondary | ICD-10-CM

## 2019-10-14 DIAGNOSIS — F988 Other specified behavioral and emotional disorders with onset usually occurring in childhood and adolescence: Secondary | ICD-10-CM | POA: Diagnosis not present

## 2019-10-14 DIAGNOSIS — G4700 Insomnia, unspecified: Secondary | ICD-10-CM

## 2019-10-14 MED ORDER — AMPHETAMINE-DEXTROAMPHET ER 10 MG PO CP24: 10.0000 mg | ORAL_CAPSULE | Freq: Every day | ORAL | 0 refills | Status: DC

## 2019-10-14 MED ORDER — EPINEPHRINE 0.3 MG/0.3ML IJ SOAJ: 0.3000 mg | Freq: Once | INTRAMUSCULAR | 5 refills | Status: DC

## 2019-10-14 MED ORDER — ESCITALOPRAM OXALATE 20 MG PO TABS: 20.0000 mg | ORAL_TABLET | Freq: Every day | ORAL | 1 refills | Status: DC

## 2019-10-14 MED ORDER — TRAZODONE HCL 50 MG PO TABS: 25.0000 mg | ORAL_TABLET | Freq: Every evening | ORAL | 3 refills | Status: DC | PRN

## 2019-10-14 NOTE — Progress Notes (Signed)
Jill Spencer DOB: 13-Jul-2000 Encounter date: 10/14/2019  This is a 19 y.o. female who presents with Chief Complaint  Patient presents with  . Medication Refill    patient states "she does not feel Lexapro is working like it used to, feels it only helped with subdued feelings and nothing else" and requests medication for ADHD  . Insomnia    x4 years    History of present illness: Doesn't feel like lexapro has done much for anxiety. Still getting very stressed out. Still getting some heart rate elevation. Still with some depressed mood.   Working at [employer].   Either goes without sleep or sleeps too much.  Never feels like she has excessive energy, just had difficulty with falling asleep.  She has tried melatonin, but without much help.  She does work closing shift so is at work until after 1 in the morning.  This makes having a regular schedule more difficult for her.   Allergies  Allergen Reactions  . Dust Mite Extract   . Latex Hives  . Nickel Hives and Itching  . Shellfish Allergy   . Sulfa Antibiotics Other (See Comments)    Flu like sx including fever   Current Meds  Medication Sig  . cetirizine (ZYRTEC) 10 MG tablet Take 10 mg by mouth daily as needed for allergies.  . ferrous sulfate 325 (65 FE) MG tablet Take 1 tablet (325 mg total) by mouth every evening.  . fluticasone (FLONASE) 50 MCG/ACT nasal spray PLACE 2 SPRAYS INTO BOTH NOSTRILS DAILY AS NEEDED FOR ALLERGIES OR RHINITIS.  Marland Kitchen SODIUM FLUORIDE 5000 PPM 1.1 % PSTE Place 1 application onto teeth every evening.   . triamcinolone cream (KENALOG) 0.1 % Apply 1 application topically 2 (two) times daily. (Patient taking differently: Apply 1-2 application topically 2 (two) times daily as needed (eczema flare ups). )  . Vitamin D, Ergocalciferol, (DRISDOL) 1.25 MG (50000 UNIT) CAPS capsule Take 1 capsule (50,000 Units total) by mouth every 7 (seven) days. (Patient taking differently: Take 50,000 Units by mouth every  Wednesday. )  . [DISCONTINUED] escitalopram (LEXAPRO) 10 MG tablet Take 1 tablet (10 mg total) by mouth daily.    Review of Systems  Constitutional: Negative for chills, fatigue and fever.  Respiratory: Negative for cough, chest tightness, shortness of breath and wheezing.   Cardiovascular: Negative for chest pain, palpitations and leg swelling.  Psychiatric/Behavioral: Positive for decreased concentration and sleep disturbance. Negative for self-injury and suicidal ideas. The patient is nervous/anxious.     Objective:  BP 118/90 (BP Location: Left Arm, Patient Position: Sitting, Cuff Size: Large)   Pulse 98   Temp 98.1 F (36.7 C) (Temporal)   Wt 223 lb 4.8 oz (101.3 kg)   LMP 09/30/2019 (Approximate)   BMI 36.04 kg/m   Weight: 223 lb 4.8 oz (101.3 kg)   BP Readings from Last 3 Encounters:  10/14/19 118/90  10/08/19 140/90  09/12/19 132/89   Wt Readings from Last 3 Encounters:  10/14/19 223 lb 4.8 oz (101.3 kg)  10/08/19 224 lb 3.2 oz (101.7 kg)  09/12/19 218 lb (98.9 kg)    Physical Exam Constitutional:      General: She is not in acute distress.    Appearance: She is well-developed.  Cardiovascular:     Rate and Rhythm: Normal rate and regular rhythm.     Heart sounds: Normal heart sounds. No murmur heard.  No friction rub.  Pulmonary:     Effort: Pulmonary effort is normal.  No respiratory distress.     Breath sounds: Normal breath sounds. No wheezing or rales.  Musculoskeletal:     Right lower leg: No edema.     Left lower leg: No edema.  Neurological:     Mental Status: She is alert and oriented to person, place, and time.  Psychiatric:        Attention and Perception: Attention normal.        Mood and Affect: Mood normal.        Behavior: Behavior normal.    GAD 7 : Generalized Anxiety Score 10/14/2019 05/14/2019  Nervous, Anxious, on Edge 3 3  Control/stop worrying 3 2  Worry too much - different things 3 2  Trouble relaxing 3 2  Restless 3 2  Easily  annoyed or irritable 3 2  Afraid - awful might happen 2 0  Total GAD 7 Score 20 13  Anxiety Difficulty Somewhat difficult Somewhat difficult    Depression screen [facility] 2/9 10/14/2019 05/14/2019 07/27/2017 03/15/2016 02/11/2015  Decreased Interest 3 1 1 1  0  Down, Depressed, Hopeless 3 3 1 1  0  PHQ - 2 Score 6 4 2 2  0  Altered sleeping 3 2 1  0 -  Tired, decreased energy 3 1 1 1  -  Change in appetite 3 2 1  0 -  Feeling bad or failure about yourself  - 1 1 0 -  Trouble concentrating 3 1 - 0 -  Moving slowly or fidgety/restless 1 0 0 0 -  Suicidal thoughts 0 0 0 0 -  PHQ-9 Score 19 11 6 3  -  Difficult doing work/chores Very difficult Somewhat difficult - - -    Assessment/Plan  1. Anaphylactic shock due to food, initial encounter Needs refill of EpiPen's.  She does keep these on hand. - EPINEPHrine (EPIPEN 2-PAK) 0.3 mg/0.3 mL IJ SOAJ injection; Inject 0.3 mLs (0.3 mg total) into the muscle once for 1 dose.  Dispense: 0.3 mL; Refill: 5  2. Attention deficit disorder (ADD) without hyperactivity ADD was confirmed on cognitive testing she recently completed.  We are going to try low-dose of Adderall.  We discussed side effects of this medication including interference with sleep and upset stomach.  We are going to start on a low dose secondary to this.  We also discussed that being on a stimulant may worsen anxiety.  She will monitor symptoms and update me through MyChart in a week. - amphetamine-dextroamphetamine (ADDERALL XR) 10 MG 24 hr capsule; Take 1 capsule (10 mg total) by mouth daily.  Dispense: 30 capsule; Refill: 0  3. Anxiety Recommend increase her Lexapro to 20 mg daily.  She is still having considerable amount of anxiety and I feel the increased dose will better control this. - escitalopram (LEXAPRO) 20 MG tablet; Take 1 tablet (20 mg total) by mouth daily.  Dispense: 90 tablet; Refill: 1  4. Insomnia, unspecified type Trial of trazodone to see if this helps with sleep.  Work  schedule does make normal sleep cycle difficult. Discussed new medication(s) today with patient. Discussed potential side effects and patient verbalized understanding.  - traZODone (DESYREL) 50 MG tablet; Take 0.5-1 tablets (25-50 mg total) by mouth at bedtime as needed for sleep.  Dispense: 30 tablet; Refill: 3   Return for pending mychart update.  She does discuss at the end of her visit that she is also still having a lot of pain.  Some days are so bad that she is even used a cane on occasion.  She is worried that she has fibromyalgia or that she is not going to have any good interventions available for hypermobile joints.  I have encouraged her to keep follow-up with rheumatology and complete ordered blood work.  We discussed that is very important to rule out treatable causes of joint inflammation, and that if coming from an autoimmune source, treatment would be different than if coming from fibromyalgia.  She also has an appointment this week with sports medicine, so I have asked her to update me after she has this visit to see how it goes.  We discussed that guided in gentle regular activity can be helpful for hypermobile joints.  I think it would be really helpful for her to be seeing a sports medicine doctor that may be able to do osteopathic manipulation on her if needed.  Review of cognitive testing notes, discussion with patient, discussion of treatment options for worsening anxiety, new diagnosis of ADD, and insomnia as well as chart completion 40 minutes. Theodis Shove, MD

## 2019-10-14 NOTE — Patient Instructions (Addendum)
Send me follow up report in 1 week - I want to know if you heard from behavioral health and see how you do with new medications. Let me know how your sports med visit goes and follow up plans.

## 2019-10-15 ENCOUNTER — Ambulatory Visit (INDEPENDENT_AMBULATORY_CARE_PROVIDER_SITE_OTHER): Payer: BC Managed Care – PPO | Admitting: Rehabilitative and Restorative Service Providers"

## 2019-10-15 ENCOUNTER — Encounter: Payer: Self-pay | Admitting: Rehabilitative and Restorative Service Providers"

## 2019-10-15 DIAGNOSIS — M79604 Pain in right leg: Secondary | ICD-10-CM

## 2019-10-15 DIAGNOSIS — M79605 Pain in left leg: Secondary | ICD-10-CM | POA: Diagnosis not present

## 2019-10-15 DIAGNOSIS — R262 Difficulty in walking, not elsewhere classified: Secondary | ICD-10-CM

## 2019-10-15 DIAGNOSIS — M6281 Muscle weakness (generalized): Secondary | ICD-10-CM

## 2019-10-15 DIAGNOSIS — M25542 Pain in joints of left hand: Secondary | ICD-10-CM | POA: Diagnosis not present

## 2019-10-15 DIAGNOSIS — M25541 Pain in joints of right hand: Secondary | ICD-10-CM

## 2019-10-15 NOTE — Therapy (Signed)
[facility] Physical Therapy [address] [place], [place], [address] Phone: [phone]   Fax:  [phone]  Physical Therapy Treatment  Patient Details  Name: Jill Spencer MRN: [id number] Date of Birth: Mar 04, 2001 Referring Provider (PT): Dr. Corliss Skains   Encounter Date: 10/15/2019   PT End of Session - 10/15/19 1010    Visit Number 3    Number of Visits 12    Date for PT Re-Evaluation 10/31/19    Authorization Time Period cert 4/43/1540 - 10/31/2019    PT Start Time 1010    PT Stop Time 1048    PT Time Calculation (min) 38 min    Activity Tolerance Patient tolerated treatment well    Behavior During Therapy [facility] for tasks assessed/performed           Past Medical History:  Diagnosis Date  . ADHD    per patient   . Eczema   . Recurrent urticaria 05/09/2016    History reviewed. No pertinent surgical history.  There were no vitals filed for this visit.   Subjective Assessment - 10/15/19 1017    Subjective Pt. indicated that all areas of complaints seem to hurt more after exercise routine and continued consistently throughout time.  Pt. indicated having MD visit yesterday and blood work up to be performed. Primary complaints of Rt hip/leg, Lt Leg and Rt shoulder pain upon arrival today.    Diagnostic tests none reported    Pain Score 4     Pain Location Leg    Pain Orientation Right;Left    Pain Descriptors / Indicators Aching;Sharp    Pain Onset More than a month ago    Pain Frequency Constant    Aggravating Factors  exercise, movement    Pain Relieving Factors unsure    Pain Score 0    Pain Location Hand    Pain Orientation Right;Left    Pain Descriptors / Indicators Aching    Pain Type Chronic pain    Pain Onset More than a month ago                             [facility] Adult PT Treatment/Exercise - 10/15/19 0001      Knee/Hip Exercises: Stretches   Other Knee/Hip Stretches supine thomas test stretch 30 sec x 5       Knee/Hip Exercises: Aerobic   Nustep Lvl 5 5 mins      Knee/Hip Exercises: Seated   Sit to Sand without UE support   18 inch table s UE assist x 15     Knee/Hip Exercises: Supine   Bridges Both;20 reps    Straight Leg Raises 2 sets;10 reps;Both    Other Supine Knee/Hip Exercises supine clam shell blue band 3 x 10 bilateral    Other Supine Knee/Hip Exercises supine adductor ball squeez 5 sec x 10                  PT Education - 10/15/19 1019    Education Details Review for HEP adjustments based off symptoms    Person(s) Educated Patient    Methods Explanation    Comprehension Verbalized understanding               PT Long Term Goals - 10/15/19 1049      PT LONG TERM GOAL #1   Title Patient will demonstrate/report pain at worst less than or equal to 2/10 to facilitate minimal limitation in daily activity  secondary to pain symptoms.    Time 6    Period Weeks    Status On-going      PT LONG TERM GOAL #2   Title Patient will demonstrate independent use of home exercise program to facilitate ability to maintain/progress functional gains from skilled physical therapy services.    Time 6    Period Weeks    Status On-going      PT LONG TERM GOAL #3   Title Pt. will demonstrate Bilateral LE MMT 5/5 throughout to facilitate usual standing, walking at PLOF s limitation.    Time 6    Period Weeks    Status On-going      PT LONG TERM GOAL #4   Title Pt. will demonstrate bilateral grip strength > 50 lbs s pain symptoms for daily grasp, hand use in work and recreational activity.    Time 6    Period Weeks    Status On-going      PT LONG TERM GOAL #5   Title Pt. will demonstrate/report ability to perform usual recreational gaming, art activity at PLOF.    Time 6    Period Weeks    Status On-going                 Plan - 10/15/19 1020    Clinical Impression Statement Exacerbation of symptoms, particularily in Rt leg, overall in last week or so.  Pt.  communicated pains increased c use of HEP.  Performance of select HEP today c minimal pain increases noted c some description of muscle fatigue noted and discussed (education on muscle soreness, DOMS vs. joint pains).    Personal Factors and Comorbidities Other;Time since onset of injury/illness/exacerbation   acid reflux, recurrent urticaria, anxiety, depression, IBS   Examination-Activity Limitations Locomotion Level;Lift;Other   write, grasp   Examination-Participation Restrictions School;Other   gaming, artistry   Stability/Clinical Decision Making Evolving/Moderate complexity    Rehab Potential Good    PT Frequency 2x / week    PT Duration 6 weeks    PT Treatment/Interventions ADLs/Self Care Home Management;Electrical Stimulation;Iontophoresis 4mg /ml Dexamethasone;Moist Heat;Traction;Balance training;Therapeutic exercise;Therapeutic activities;Functional mobility training;Stair training;Gait training;Ultrasound;Neuromuscular re-education;Patient/family education;Manual techniques;Taping;Dry needling;Passive range of motion;Joint Manipulations;Spinal Manipulations    PT Next Visit Plan Adjustment of HEP intensity and performance to accomdate any symptom increases.  Continued strengthening indicated overall to improve biomechanics in progressive mobility.    PT Home Exercise Plan [id number]    Consulted and Agree with Plan of Care Patient           Patient will benefit from skilled therapeutic intervention in order to improve the following deficits and impairments:  Hypermobility, Postural dysfunction, Pain, Abnormal gait, Decreased endurance, Decreased activity tolerance, Decreased strength, Impaired UE functional use, Decreased balance, Difficulty walking, Decreased coordination  Visit Diagnosis: Pain in joints of left hand  Pain in joints of right hand  Pain in left leg  Pain in right leg  Muscle weakness (generalized)  Difficulty in walking, not elsewhere  classified     Problem List Patient Active Problem List   Diagnosis Date Noted  . Acid reflux 06/20/2016  . Recurrent urticaria 05/09/2016  . Anxiety and depression 10/13/2014  . IBS (irritable bowel syndrome) 10/13/2014    Scot Jun, PT, DPT, OCS, ATC 10/15/19  10:49 AM    [facility] Physical Therapy [address] [place], [place], [address] Phone: [phone]   Fax:  [phone]  Name: Jill Spencer MRN: [id number] Date of Birth: [birth year]

## 2019-10-17 ENCOUNTER — Encounter: Payer: BC Managed Care – PPO | Admitting: Rehabilitative and Restorative Service Providers"

## 2019-10-18 ENCOUNTER — Other Ambulatory Visit: Payer: Self-pay

## 2019-10-18 ENCOUNTER — Ambulatory Visit (INDEPENDENT_AMBULATORY_CARE_PROVIDER_SITE_OTHER): Payer: BC Managed Care – PPO | Admitting: Family Medicine

## 2019-10-18 VITALS — BP 118/84 | Ht 66.0 in | Wt 220.0 lb

## 2019-10-18 DIAGNOSIS — M357 Hypermobility syndrome: Secondary | ICD-10-CM

## 2019-10-18 NOTE — Progress Notes (Signed)
    Jill Spencer  

## 2019-10-18 NOTE — Patient Instructions (Signed)
We have sent a referral in for one of our Physical Therapists that subspecializes in hypermobility disorders. Please complete your blood work that your rheumatologist has ordered, work with the PT for a couple of sessions and then come back and see me in clinic in 2-4 weeks. If you have questions in the mean time, please give [place] a call. Great to meet you!

## 2019-10-19 DIAGNOSIS — M357 Hypermobility syndrome: Secondary | ICD-10-CM | POA: Insufficient documentation

## 2019-10-19 NOTE — Assessment & Plan Note (Signed)
Beighton score of 4.  Rheumatology has outstanding lab work for her.  She has worked previously with a physical therapist but not 1 who was some specializing in hypermobility disorders so we will set her up with our physical therapist for further evaluation and management.  Her main issues seem to be fatigue and myalgias/arthralgias.  She is not having any recurrent dislocations.  I will see her back in about 4 weeks.

## 2019-10-19 NOTE — Progress Notes (Signed)
  TORUNN CHANCELLOR - 19 y.o. female MRN [id number]  Date of birth: [birth year]    SUBJECTIVE:      Chief Complaint:/ HPI:    19 year old patient here for further evaluation of hypermobility syndrome.  Has been seen by rheumatology and they have ordered lab work which has not been done yet.  Patient reports long history of multiple arthralgias and joint pains, significant most recently in her right hip and especially her right knee.  She reports always being hyperflexible.  Has never had a joint dislocations.  Review of systems: History of irritable bowel syndrome but has spontaneous bowel movements.  Menses have become irregular since January but previously were normal.  No chest pain.  No other GI upsets.  No episodes of syncope or presyncope.  No heart palpitations.  She does note some striae but otherwise no significant skin issues.  Positive for arthralgias, muscle tenderness and fatigue that is present greater than 70% of the time.  Activities of Daily living: Early morning joint discomfort that is generalized but gradually improves.  No significant a.m. stiffness.  No problems climbing stairs or with gait.  No fine motor skill issues specifically no problems using knife and fork, no problems buttoning, no problems writing or using computer.  Past medical history: History of irritable bowel syndrome, anxiety, urticaria, ADHD, Reflux. OBJECTIVE: BP 118/84   Ht 5\' 6"  (1.676 m)   Wt 220 lb (99.8 kg)   LMP 09/30/2019 (Approximate)   BMI 35.51 kg/m   Physical Exam:  Vital signs are reviewed. GENERAL: Well-developed overweight, no acute distress. MSK: Normal range of motion in all planes for the cervical spine.  No cervical spine tenderness.  Bilateral shoulders, hips, knees, ankles, wrists have full range of motion.  She has mild recurvatum at the elbow bilaterally and at the knee bilaterally.  She cannot place her hands on the floor with palms flat.  She has increased range of motion and  hyperextension of the lumbar spine lateral abduction of bilateral hips.  She can place her thumb on her forearm bilaterally. Skin: Several areas of striae on the abdomen, lower back, anterior chest.  The skin is normal in texture and does not have an unusual velvety feel.  There is no rash.  No hyperpigmentation. Beighton score equal 4 PSYCH: Alert and oriented x4.  Asks and answers questions appropriately.  Mildly constricted affect.  No sign of agitation or psychomotor retardation. ASSESSMENT & PLAN:  See problem based charting & AVS for pt instructions. Hypermobility syndrome Beighton score of 4.  Rheumatology has outstanding lab work for her.  She has worked previously with a physical therapist but not 1 who was some specializing in hypermobility disorders so we will set her up with our physical therapist for further evaluation and management.  Her main issues seem to be fatigue and myalgias/arthralgias.  She is not having any recurrent dislocations.  I will see her back in about 4 weeks.

## 2019-10-22 ENCOUNTER — Encounter: Payer: Self-pay | Admitting: Family Medicine

## 2019-10-22 ENCOUNTER — Encounter: Payer: BC Managed Care – PPO | Admitting: Rehabilitative and Restorative Service Providers"

## 2019-10-22 ENCOUNTER — Ambulatory Visit: Payer: BC Managed Care – PPO | Attending: Family Medicine | Admitting: Physical Therapy

## 2019-10-22 ENCOUNTER — Encounter: Payer: Self-pay | Admitting: Physical Therapy

## 2019-10-22 ENCOUNTER — Other Ambulatory Visit: Payer: Self-pay

## 2019-10-22 DIAGNOSIS — M357 Hypermobility syndrome: Secondary | ICD-10-CM | POA: Diagnosis not present

## 2019-10-22 DIAGNOSIS — M6281 Muscle weakness (generalized): Secondary | ICD-10-CM

## 2019-10-22 DIAGNOSIS — M79604 Pain in right leg: Secondary | ICD-10-CM

## 2019-10-22 DIAGNOSIS — R293 Abnormal posture: Secondary | ICD-10-CM | POA: Diagnosis not present

## 2019-10-22 DIAGNOSIS — M79605 Pain in left leg: Secondary | ICD-10-CM

## 2019-10-22 NOTE — Therapy (Signed)
[facility] Outpatient Rehabilitation [facility] [address] [place], [place], [address] Phone: [phone]   Fax:  [phone]  Physical Therapy Evaluation  Patient Details  Name: Jill Spencer MRN: [id number] Date of Birth: 2000-12-28 Referring Provider (PT): Dr. Corliss Skains, Dr. Denny Levy    Encounter Date: 10/22/2019   PT End of Session - 10/22/19 1246    Visit Number 1    Number of Visits 12    Date for PT Re-Evaluation 12/03/19    Authorization Type BCBS    PT Start Time [phone]    PT Stop Time 0915    PT Time Calculation (min) 42 min    Activity Tolerance Patient tolerated treatment well    Behavior During Therapy [facility] for tasks assessed/performed           Past Medical History:  Diagnosis Date   ADHD    per patient    Eczema    Recurrent urticaria 05/09/2016    History reviewed. No pertinent surgical history.  There were no vitals filed for this visit.    Subjective Assessment - 10/22/19 0839    Subjective Patient has had pain for the past 4-5 yrs. due to diagnosed hypermobility syndrome. She has been doing PT in another clinic for the same problems but with increased pain.  She has subluxed Rt knee in Dec 2020.  She has pain in back, shoulders, Rt hip, Rt knee and Rt ankle.  She has dizziness.  She has vague weakness in Rt knee with activity , fatigued.  "Depending on what Im doing I have pain all the time"    Pertinent History eczema, joint pain    Limitations Standing;Walking;Lifting;House hold activities    How long can you stand comfortably? can be standing for 4 hours, takes a break at work,  then pain continues after that all night    How long can you walk comfortably? depends on the day    Diagnostic tests blood test upcoming    Patient Stated Goals Patient would like to be able to manage her pain    Currently in Pain? Yes    Pain Score 2     Pain Location Knee    Pain Orientation Right    Pain Descriptors / Indicators Sore;Sharp      Pain Type Chronic pain    Pain Onset More than a month ago    Pain Frequency Intermittent    Aggravating Factors  standing , walking , movement, exercise    Pain Relieving Factors rest    Effect of Pain on Daily Activities hard to do my job .  Tired all the time.    Multiple Pain Sites Yes    Pain Score 1    Pain Location Arm    Pain Orientation Right    Pain Descriptors / Indicators Aching    Pain Type Chronic pain    Pain Radiating Towards shoulder and elbow    Pain Onset More than a month ago    Pain Frequency Intermittent    Aggravating Factors  working, activity    Pain Relieving Factors rest    Effect of Pain on Daily Activities difficult to use dominant arm              [facility] PT Assessment - 10/22/19 0001      Assessment   Medical Diagnosis Hypermobility arthralgia    Referring Provider (PT) Dr. Corliss Skains, Dr. Denny Levy     Onset Date/Surgical Date --  5 yrs    Hand Dominance Right    Prior Therapy Yes       Precautions   Precautions None      Restrictions   Weight Bearing Restrictions No      Balance Screen   Has the patient fallen in the past 6 months No      Home Environment   Living Environment Private residence    Research officer, trade union;Other relatives    Type of Home House    Home Layout Two level      Prior Function   Level of Independence Independent    Leisure artist, video games      Cognition   Overall Cognitive Status Within Functional Limits for tasks assessed      Observation/Other Assessments   Observations observed skin stria on arms, legs and trunk.  Skin smooth and elasticity increased on dorsum of hand     Other Surveys  --   Bristol Impact Scale given on eval for next visit      Sensation   Additional Comments fingertips at times       Single Leg Stance   Comments reduced bilateral < 10 sec       Posture/Postural Control   Posture/Postural Control Postural limitations    Postural Limitations Rounded  Shoulders;Forward head    Posture Comments poor muscular support and activity, leans on R or Lt. Leg , pelvic obliquity , knee recurvatum, genu valgus       AROM   Right Knee Extension 10    Right Knee Flexion 140    Left Knee Extension 12    Left Knee Flexion 145      PROM   Overall PROM Comments hip ER (excessive) 75-80 deg and IR 60 deg approx.        Strength   Overall Strength Comments hip flex 5/5 , knees 5/5 shoulder 4/5     Right Hip Flexion 5/5    Right Hip Extension 4+/5    Right Hip ABduction 4+/5    Left Hip Flexion 5/5    Left Hip Extension 4+/5    Left Hip ABduction 4+/5      Flexibility   Hamstrings tight L >R       Palpation   Patella mobility lateral      Special Tests   Other special tests Beighton Score 7/9             Objective measurements completed on examination: See above findings.      PT Education - 10/22/19 1245    Education Details PT, stability , EDS vs hypermobility syndome,  website, hand/finger splints and bracing for joint support, Pilates    Person(s) Educated Patient    Methods Demonstration;Explanation    Comprehension Verbalized understanding;Returned demonstration               PT Long Term Goals - 10/22/19 1254      PT LONG TERM GOAL #1   Title Patient will show improved standing posture when cued due to better proprioception in joints    Time 6    Period Weeks    Status New    Target Date 12/03/19      PT LONG TERM GOAL #2   Title Patient will demonstrate independent use of home exercise program to facilitate ability to maintain/progress functional gains from skilled physical therapy services.    Baseline unknown    Time 6    Period Weeks  Status New    Target Date 12/03/19      PT LONG TERM GOAL #3   Title Pt will be able to work for 4 hours with pain no more than moderate 4/10-5/10 in back, hips and knees.    Time 6    Period Weeks    Status New    Target Date 12/03/19      PT LONG TERM GOAL #4    Title Pt will be able to report min to no pain ascending and descending stairs in her home.    Time 6    Period Weeks    Status New    Target Date 12/03/19      PT LONG TERM GOAL #5   Title Pt will be able to walk without her cane for 3 weeks consecutively due to less pain overall    Time 6    Period Weeks    Status New    Target Date 12/03/19                  Plan - 10/22/19 1305    Clinical Impression Statement Patient referred to PT by Dr. Jennette Kettle specifically for hypermobility syndrome.  She was tranferrred to [facility]. location as I am very familiar with this population of patients. She shows 7/9 on the Beighton scale with skin changes and meets certain criteria for hypermobile EDS .  Seems that traditional PT is increasing pain .  Will guide patient throughout proximal stability exercises and offer resources for joint protection, education.    Personal Factors and Comorbidities Other;Time since onset of injury/illness/exacerbation;Behavior Pattern;Past/Current Experience    Examination-Activity Limitations Locomotion Level;Lift;Other;Stand;Stairs;Carry    Examination-Participation Restrictions School;Other;Community Activity    Stability/Clinical Decision Making Evolving/Moderate complexity    Clinical Decision Making Moderate    Rehab Potential Good    PT Frequency 2x / week    PT Duration 6 weeks    PT Treatment/Interventions ADLs/Self Care Home Management;Electrical Stimulation;Iontophoresis 4mg /ml Dexamethasone;Moist Heat;Traction;Balance training;Therapeutic exercise;Therapeutic activities;Functional mobility training;Stair training;Gait training;Ultrasound;Neuromuscular re-education;Patient/family education;Manual techniques;Taping;Dry needling;Passive range of motion;Joint Manipulations;Spinal Manipulations;Cryotherapy    PT Next Visit Plan core.  check hip MMT ext and abd , prone L spine aassessment. Out of town 7/7/-7/26    PT Home Exercise Plan 05-30-1974: bridge, small  ROM SLR and gripping towel previous PT episode    Consulted and Agree with Plan of Care Patient           Patient will benefit from skilled therapeutic intervention in order to improve the following deficits and impairments:  Hypermobility, Postural dysfunction, Pain, Abnormal gait, Decreased endurance, Decreased activity tolerance, Decreased strength, Impaired UE functional use, Decreased balance, Difficulty walking, Decreased coordination, Impaired flexibility, Decreased mobility, Obesity, Dizziness  Visit Diagnosis: Pain in left leg  Hypermobility syndrome  Pain in right leg  Muscle weakness (generalized)  Abnormal posture     Problem List Patient Active Problem List   Diagnosis Date Noted   Hypermobility syndrome 10/19/2019   Hypermobility arthralgia 03/23/2017   Acid reflux 06/20/2016   Recurrent urticaria 05/09/2016   Anxiety and depression 10/13/2014   IBS (irritable bowel syndrome) 10/13/2014    Shayleigh Bouldin 10/22/2019, 1:21 PM  [facility] Outpatient Rehabilitation [facility] [address] [place], [place], [place] Phone: [phone]   Fax:  [phone]  Name: LULIE HURD MRN: Laural Roes Date of Birth: 2000/06/21   07/23/2000, PT 10/22/19 1:21 PM Phone: [phone] Fax: [phone]

## 2019-10-24 ENCOUNTER — Encounter: Payer: Self-pay | Admitting: Physical Therapy

## 2019-10-24 ENCOUNTER — Ambulatory Visit: Payer: BC Managed Care – PPO | Attending: Family Medicine | Admitting: Physical Therapy

## 2019-10-24 ENCOUNTER — Ambulatory Visit (INDEPENDENT_AMBULATORY_CARE_PROVIDER_SITE_OTHER): Payer: BC Managed Care – PPO | Admitting: Psychology

## 2019-10-24 ENCOUNTER — Encounter: Payer: BC Managed Care – PPO | Admitting: Rehabilitative and Restorative Service Providers"

## 2019-10-24 ENCOUNTER — Other Ambulatory Visit: Payer: Self-pay

## 2019-10-24 DIAGNOSIS — M357 Hypermobility syndrome: Secondary | ICD-10-CM | POA: Insufficient documentation

## 2019-10-24 DIAGNOSIS — F602 Antisocial personality disorder: Secondary | ICD-10-CM | POA: Diagnosis not present

## 2019-10-24 DIAGNOSIS — F411 Generalized anxiety disorder: Secondary | ICD-10-CM

## 2019-10-24 DIAGNOSIS — M6281 Muscle weakness (generalized): Secondary | ICD-10-CM | POA: Diagnosis not present

## 2019-10-24 DIAGNOSIS — M79604 Pain in right leg: Secondary | ICD-10-CM | POA: Insufficient documentation

## 2019-10-24 DIAGNOSIS — M79605 Pain in left leg: Secondary | ICD-10-CM | POA: Insufficient documentation

## 2019-10-24 DIAGNOSIS — F331 Major depressive disorder, recurrent, moderate: Secondary | ICD-10-CM | POA: Diagnosis not present

## 2019-10-24 DIAGNOSIS — R293 Abnormal posture: Secondary | ICD-10-CM | POA: Diagnosis not present

## 2019-10-24 NOTE — Therapy (Signed)
[facility] Outpatient Rehabilitation [facility] [address] [place], [place], [address] Phone: [phone]   Fax:  [phone]  Physical Therapy Treatment  Patient Details  Name: Jill Spencer MRN: [id number] Date of Birth: 08/20/2000 Referring Provider (PT): Dr. Corliss Skains, Dr. Denny Levy    Encounter Date: 10/24/2019   PT End of Session - 10/24/19 1631    Visit Number 2    Number of Visits 12    Date for PT Re-Evaluation 12/03/19    Authorization Type BCBS    Authorization Time Period --    PT Start Time 1535    PT Stop Time 1618    PT Time Calculation (min) 43 min    Activity Tolerance Patient tolerated treatment well    Behavior During Therapy [facility] for tasks assessed/performed           Past Medical History:  Diagnosis Date  . ADHD    per patient   . Eczema   . Recurrent urticaria 05/09/2016    History reviewed. No pertinent surgical history.  There were no vitals filed for this visit.   Subjective Assessment - 10/24/19 1539    Subjective Yesterday my R shoulder and legs were hurting.  Rt knee consistently painful, 4/10.    Currently in Pain? Yes               [facility] Adult PT Treatment/Exercise - 10/24/19 0001      Self-Care   Self-Care Other Self-Care Comments    Other Self-Care Comments  HEp, , tape, stabilization concepts       Lumbar Exercises: Supine   Ab Set 10 reps    Pelvic Tilt 10 reps    Clam 20 reps      Knee/Hip Exercises: Supine   Straight Leg Raise with External Rotation Strengthening;Both;1 set;10 reps    Straight Leg Raise with External Rotation Limitations VMO       Knee/Hip Exercises: Sidelying   Clams x 15 yellow band       Manual Therapy   McConnell Pulled patella medially                   PT Education - 10/24/19 1630    Education Details HEP, tape for knee    Person(s) Educated Patient    Methods Explanation;Handout    Comprehension Verbalized understanding;Returned demonstration                PT Long Term Goals - 10/22/19 1254      PT LONG TERM GOAL #1   Title Patient will show improved standing posture when cued due to better proprioception in joints    Time 6    Period Weeks    Status New    Target Date 12/03/19      PT LONG TERM GOAL #2   Title Patient will demonstrate independent use of home exercise program to facilitate ability to maintain/progress functional gains from skilled physical therapy services.    Baseline unknown    Time 6    Period Weeks    Status New    Target Date 12/03/19      PT LONG TERM GOAL #3   Title Pt will be able to work for 4 hours with pain no more than moderate 4/10-5/10 in back, hips and knees.    Time 6    Period Weeks    Status New    Target Date 12/03/19      PT LONG TERM GOAL #4  Title Pt will be able to report min to no pain ascending and descending stairs in her home.    Time 6    Period Weeks    Status New    Target Date 12/03/19      PT LONG TERM GOAL #5   Title Pt will be able to walk without her cane for 3 weeks consecutively due to less pain overall    Time 6    Period Weeks    Status New    Target Date 12/03/19                 Plan - 10/24/19 1540    Clinical Impression Statement Patient was able to perform basic lumbar stability program without increasing pain.  Strength is good but shows decreased joint control and proprioception in knees ankles and hips. Trial of tape to Rt patella.  Will see if her MD will write her a script for OT for measuring splints for fingers.    PT Treatment/Interventions ADLs/Self Care Home Management;Electrical Stimulation;Iontophoresis 4mg /ml Dexamethasone;Moist Heat;Traction;Balance training;Therapeutic exercise;Therapeutic activities;Functional mobility training;Stair training;Gait training;Ultrasound;Neuromuscular re-education;Patient/family education;Manual techniques;Taping;Dry needling;Passive range of motion;Joint Manipulations;Spinal Manipulations;Cryotherapy     PT Next Visit Plan core.  check hip MMT ext and abd , prone L spine aassessment. Out of town 7/7/-7/26    PT Home Exercise Plan stabilization: bent knee fall out, ER/IR red band, Tr A, SL clam red    Consulted and Agree with Plan of Care Patient           Patient will benefit from skilled therapeutic intervention in order to improve the following deficits and impairments:  Hypermobility, Postural dysfunction, Pain, Abnormal gait, Decreased endurance, Decreased activity tolerance, Decreased strength, Impaired UE functional use, Decreased balance, Difficulty walking, Decreased coordination, Impaired flexibility, Decreased mobility, Obesity, Dizziness  Visit Diagnosis: Hypermobility syndrome  Pain in left leg  Pain in right leg  Muscle weakness (generalized)  Abnormal posture     Problem List Patient Active Problem List   Diagnosis Date Noted  . Hypermobility syndrome 10/19/2019  . 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 Acid reflux 06/20/2016  . Recurrent urticaria 05/09/2016  . Anxiety and depression 10/13/2014  . IBS (irritable bowel syndrome) 10/13/2014    Micky Sheller 10/24/2019, 4:34 PM  [facility] Outpatient Rehabilitation [facility] [address] [place], [place], [place] Phone: [phone]   Fax:  [phone]  Name: Jill Spencer MRN: Laural Roes Date of Birth: 2000-10-23  07/23/2000, PT 10/24/19 4:35 PM Phone: [phone] Fax: [phone]

## 2019-10-29 ENCOUNTER — Encounter: Payer: BC Managed Care – PPO | Admitting: Rehabilitative and Restorative Service Providers"

## 2019-10-29 MED ORDER — AMPHETAMINE-DEXTROAMPHET ER 20 MG PO CP24
20.0000 mg | ORAL_CAPSULE | ORAL | 0 refills | Status: DC
Start: 2019-10-29 — End: 2020-01-20

## 2019-11-06 ENCOUNTER — Other Ambulatory Visit: Payer: Self-pay | Admitting: Family Medicine

## 2019-11-06 DIAGNOSIS — G4700 Insomnia, unspecified: Secondary | ICD-10-CM

## 2019-11-07 NOTE — Progress Notes (Deleted)
Office Visit Note  Patient: Jill Spencer             Date of Birth: 08/19/2000           MRN: [id number]             PCP: Caren Macadam, MD Referring: Caren Macadam, MD Visit Date: 11/19/2019 Occupation: @GUAROCC @  Subjective:  No chief complaint on file.   History of Present Illness: Jill Spencer is a 19 y.o. female ***   Activities of Daily Living:  Patient reports morning stiffness for *** {minute/hour:19697}.   Patient {ACTIONS;DENIES/REPORTS:21021675::"Denies"} nocturnal pain.  Difficulty dressing/grooming: {ACTIONS;DENIES/REPORTS:21021675::"Denies"} Difficulty climbing stairs: {ACTIONS;DENIES/REPORTS:21021675::"Denies"} Difficulty getting out of chair: {ACTIONS;DENIES/REPORTS:21021675::"Denies"} Difficulty using hands for taps, buttons, cutlery, and/or writing: {ACTIONS;DENIES/REPORTS:21021675::"Denies"}  No Rheumatology ROS completed.   PMFS History:  Patient Active Problem List   Diagnosis Date Noted  . Hypermobility syndrome 10/19/2019  . 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 Acid reflux 06/20/2016  . Recurrent urticaria 05/09/2016  . Anxiety and depression 10/13/2014  . IBS (irritable bowel syndrome) 10/13/2014    Past Medical History:  Diagnosis Date  . ADHD    per patient   . Eczema   . Recurrent urticaria 05/09/2016    Family History  Problem Relation Age of Onset  . Hypertension Mother   . Hyperlipidemia Mother   . Cancer Maternal Grandfather        brain   . Diabetes Paternal Grandmother   . Congestive Heart Failure Paternal Grandmother   . Hypertension Father   . Arthritis Father   . Eczema Paternal Uncle   . Cancer Paternal Grandfather        skin, blood  . Migraines Sister   . Multiple sclerosis Other        paternal aunt; not entirely sure of diagnosis   No past surgical history on file. Social History   Social History Narrative  . Not on file   Immunization History  Administered Date(s) Administered  .  DTaP 09/21/1999, 11/22/1999, 01/24/2000, 01/19/2001, 08/05/2004  . Hepatitis B 08-16-99, 08/26/1999, 05/09/2000  . HiB (PRP-OMP) 09/21/1999, 11/22/1999, 01/24/2000, 10/20/2000  . IPV 09/21/1999, 11/22/1999, 01/19/2002, 08/05/2004  . Influenza,inj,Quad PF,6+ Mos 03/12/2015  . MMR 10/20/2000, 08/05/2004  . Meningococcal Conjugate 01/13/2010  . Meningococcal Polysaccharide 03/15/2016  . Pneumococcal Conjugate-13 09/21/1999, 11/22/1999, 01/25/2000, 06/30/2000  . Tdap 01/13/2010  . Varicella 10/20/2000     Objective: Vital Signs: There were no vitals taken for this visit.   Physical Exam   Musculoskeletal Exam: ***  CDAI Exam: CDAI Score: -- Patient Global: --; Provider Global: -- Swollen: --; Tender: -- Joint Exam 11/19/2019   No joint exam has been documented for this visit   There is currently no information documented on the homunculus. Go to the Rheumatology activity and complete the homunculus joint exam.  Investigation: No additional findings.  Imaging: No results found.  Recent Labs: Lab Results  Component Value Date   WBC 8.4 09/12/2019   HGB 10.9 (L) 09/12/2019   PLT 219 09/12/2019   NA 137 09/12/2019   K 3.6 09/12/2019   CL 103 09/12/2019   CO2 23 09/12/2019   GLUCOSE 111 (H) 09/12/2019   BUN 7 09/12/2019   CREATININE 0.85 09/12/2019   BILITOT 0.3 05/14/2019   ALKPHOS 79 05/14/2019   AST 16 05/14/2019   ALT 14 05/14/2019   PROT 7.6 05/14/2019   ALBUMIN 4.1 05/14/2019   CALCIUM 9.0 09/12/2019   GFRAA >60 09/12/2019    Speciality  Comments: No specialty comments available.  Procedures:  No procedures performed Allergies: Dust mite extract, Latex, Nickel, Shellfish allergy, and Sulfa antibiotics   Assessment / Plan:     Visit Diagnoses: No diagnosis found.  Orders: No orders of the defined types were placed in this encounter.  No orders of the defined types were placed in this encounter.   Face-to-face time spent with patient was ***  minutes. Greater than 50% of time was spent in counseling and coordination of care.  Follow-Up Instructions: No follow-ups on file.   Earnestine Mealing, CMA  Note - This record has been created using Editor, commissioning.  Chart creation errors have been sought, but may not always  have been located. Such creation errors do not reflect on  the standard of medical care.

## 2019-11-19 ENCOUNTER — Ambulatory Visit: Payer: BC Managed Care – PPO | Admitting: Rheumatology

## 2019-11-25 ENCOUNTER — Ambulatory Visit (INDEPENDENT_AMBULATORY_CARE_PROVIDER_SITE_OTHER): Payer: BC Managed Care – PPO | Admitting: Psychology

## 2019-11-25 DIAGNOSIS — F902 Attention-deficit hyperactivity disorder, combined type: Secondary | ICD-10-CM

## 2019-11-25 DIAGNOSIS — F411 Generalized anxiety disorder: Secondary | ICD-10-CM

## 2019-11-25 DIAGNOSIS — F331 Major depressive disorder, recurrent, moderate: Secondary | ICD-10-CM | POA: Diagnosis not present

## 2019-11-26 ENCOUNTER — Ambulatory Visit: Payer: BC Managed Care – PPO | Attending: Family Medicine | Admitting: Physical Therapy

## 2019-11-26 ENCOUNTER — Other Ambulatory Visit: Payer: Self-pay

## 2019-11-26 ENCOUNTER — Encounter: Payer: Self-pay | Admitting: Physical Therapy

## 2019-11-26 DIAGNOSIS — M357 Hypermobility syndrome: Secondary | ICD-10-CM | POA: Diagnosis not present

## 2019-11-26 DIAGNOSIS — R262 Difficulty in walking, not elsewhere classified: Secondary | ICD-10-CM | POA: Diagnosis not present

## 2019-11-26 DIAGNOSIS — R293 Abnormal posture: Secondary | ICD-10-CM

## 2019-11-26 DIAGNOSIS — M6281 Muscle weakness (generalized): Secondary | ICD-10-CM | POA: Diagnosis not present

## 2019-11-26 DIAGNOSIS — M25541 Pain in joints of right hand: Secondary | ICD-10-CM

## 2019-11-26 DIAGNOSIS — M79605 Pain in left leg: Secondary | ICD-10-CM

## 2019-11-26 DIAGNOSIS — M79604 Pain in right leg: Secondary | ICD-10-CM

## 2019-11-26 DIAGNOSIS — M25542 Pain in joints of left hand: Secondary | ICD-10-CM | POA: Diagnosis not present

## 2019-11-26 NOTE — Therapy (Signed)
[facility] [address] [place], [place], [address] Phone: [phone]   Fax:  [phone]  Physical Therapy Treatment  Patient Details  Name: Jill Spencer MRN: [id number] Date of Birth: December 07, 2000 Referring Provider (PT): Dr. Corliss Skains, Dr. Denny Levy    Encounter Date: 11/26/2019   PT End of Session - 11/26/19 1501    Visit Number 3    Number of Visits 12    Date for PT Re-Evaluation 12/03/19    Authorization Type BCBS    PT Start Time 1407    PT Stop Time 1445    PT Time Calculation (min) 38 min    Activity Tolerance Patient tolerated treatment well    Behavior During Therapy [facility] for tasks assessed/performed           Past Medical History:  Diagnosis Date  . ADHD    per patient   . Eczema   . Recurrent urticaria 05/09/2016    History reviewed. No pertinent surgical history.  There were no vitals filed for this visit.   Subjective Assessment - 11/26/19 1410    Subjective Was out of town for a month, went across the country with friends.  R knee has been hurting.    Currently in Pain? Yes    Pain Score 2     Pain Location Knee              [facility] PT Assessment - 11/26/19 0001      AROM   Lumbar Flexion finger tips to floor , shifted to L     Lumbar Extension excessive     Lumbar - Right Side [facility] but discomfort     Lumbar - Left Side [facility] but discomfort     Lumbar - Right Rotation WFL     Lumbar - Left Rotation [facility]       Strength   Right Hip Extension 4+/5    Right Hip ABduction 4+/5    Left Hip Extension 4+/5    Left Hip ABduction 4+/5              [facility] Adult PT Treatment/Exercise - 11/26/19 0001      Lumbar Exercises: Supine   Ab Set 10 reps    Pelvic Tilt 10 reps    Clam 20 reps    Clam Limitations unilateral with and without band       Knee/Hip Exercises: Standing   Other Standing Knee Exercises step ups 6 inch Rt LE x 20, step up and over 6 inch x 15 with UE assist.   Compared to L LE much more challenging to quad , compensates with trunk.       Knee/Hip Exercises: Supine   Straight Leg Raise with External Rotation Strengthening;Both;1 set;15 reps    Straight Leg Raise with External Rotation Limitations VMO       Knee/Hip Exercises: Sidelying   Clams x 15 red band       Shoulder Exercises: Seated   External Rotation Strengthening;Both;15 reps;Theraband    Theraband Level (Shoulder External Rotation) Level 2 (Red)      Manual Therapy   McConnell Pulled patella medially                        PT Long Term Goals - 11/26/19 1428      PT LONG TERM GOAL #1   Title Patient will show improved standing posture when cued due to better proprioception in  joints    Status On-going      PT LONG TERM GOAL #2   Title Patient will demonstrate independent use of home exercise program to facilitate ability to maintain/progress functional gains from skilled physical therapy services.    Status On-going      PT LONG TERM GOAL #3   Title Pt will be able to work for 4 hours with pain no more than moderate 4/10-5/10 in back, hips and knees.    Status On-going      PT LONG TERM GOAL #4   Title Pt will be able to report min to no pain ascending and descending stairs in her home and at work    Status Revised      PT LONG TERM GOAL #5   Title Pt will be able to walk without her cane for 3 weeks consecutively due to less pain overall    Baseline uses 2 times per week    Status On-going                 Plan - 11/26/19 1411    Clinical Impression Statement Patient was out of town for several weeks.  Seems to have stayed at a baseline level of pain in Rt knee.  Reviewed HEP and worked on knee control in standing with tape.  She needs cues for gently unlocking knee in SLS. 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    PT Treatment/Interventions ADLs/Self Care Home Management;Electrical  Stimulation;Iontophoresis 4mg /ml Dexamethasone;Moist Heat;Traction;Balance training;Therapeutic exercise;Therapeutic activities;Functional mobility training;Stair training;Gait training;Ultrasound;Neuromuscular re-education;Patient/family education;Manual techniques;Taping;Dry needling;Passive range of motion;Joint Manipulations;Spinal Manipulations;Cryotherapy    PT Next Visit Plan core stabilization, knee control in standing, Reformer    PT Home Exercise Plan stabilization: bent knee fall out, ER/IR red band, Tr A, SL clam red    Consulted and Agree with Plan of Care Patient           Patient will benefit from skilled therapeutic intervention in order to improve the following deficits and impairments:  Hypermobility, Postural dysfunction, Pain, Abnormal gait, Decreased endurance, Decreased activity tolerance, Decreased strength, Impaired UE functional use, Decreased balance, Difficulty walking, Decreased coordination, Impaired flexibility, Decreased mobility, Obesity, Dizziness  Visit Diagnosis: Hypermobility syndrome  Pain in left leg  Pain in right leg  Muscle weakness (generalized)  Abnormal posture  Pain in joints of left hand  Pain in joints of right hand  Difficulty in walking, not elsewhere classified     Problem List Patient Active Problem List   Diagnosis Date Noted  . Hypermobility syndrome 10/19/2019  . 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 Acid reflux 06/20/2016  . Recurrent urticaria 05/09/2016  . Anxiety and depression 10/13/2014  . IBS (irritable bowel syndrome) 10/13/2014    Doxie Augenstein 11/26/2019, 3:02 PM  [facility] [address] [place], [place], [place] Phone: [phone]   Fax:  [phone]  Name: Jill Spencer MRN: Laural Roes Date of Birth: 06/17/2000  07/23/2000, PT 11/26/19 3:02 PM Phone: [phone] Fax: [phone]

## 2019-11-28 ENCOUNTER — Ambulatory Visit: Payer: BC Managed Care – PPO | Admitting: Physical Therapy

## 2019-12-03 ENCOUNTER — Other Ambulatory Visit: Payer: Self-pay

## 2019-12-03 ENCOUNTER — Ambulatory Visit: Payer: BC Managed Care – PPO | Admitting: Physical Therapy

## 2019-12-03 ENCOUNTER — Encounter: Payer: Self-pay | Admitting: Physical Therapy

## 2019-12-03 DIAGNOSIS — R262 Difficulty in walking, not elsewhere classified: Secondary | ICD-10-CM

## 2019-12-03 DIAGNOSIS — M357 Hypermobility syndrome: Secondary | ICD-10-CM

## 2019-12-03 DIAGNOSIS — M25542 Pain in joints of left hand: Secondary | ICD-10-CM

## 2019-12-03 DIAGNOSIS — R293 Abnormal posture: Secondary | ICD-10-CM | POA: Diagnosis not present

## 2019-12-03 DIAGNOSIS — M6281 Muscle weakness (generalized): Secondary | ICD-10-CM

## 2019-12-03 DIAGNOSIS — M79604 Pain in right leg: Secondary | ICD-10-CM

## 2019-12-03 DIAGNOSIS — M79605 Pain in left leg: Secondary | ICD-10-CM | POA: Diagnosis not present

## 2019-12-03 DIAGNOSIS — M25541 Pain in joints of right hand: Secondary | ICD-10-CM

## 2019-12-03 NOTE — Patient Instructions (Signed)
HIP: Adduction - Side-Lying    Lie on side with top leg crossed in front of bottom leg. Raise bottom leg, keep knee straight. _15-20__ reps per set, __2_ sets per day, _5__ days per week   Copyright  VHI. All rights reserved.

## 2019-12-03 NOTE — Therapy (Addendum)
[place], [place], [address] Phone: [phone]   Fax:  [phone]  Physical Therapy Treatment/Discharge  Patient Details  Name: Jill Spencer MRN: [id number] Date of Birth: 17-Jul-2000 Referring Provider (PT): Dr. Estanislado Pandy, Dr. Dorcas Mcmurray    Encounter Date: 12/03/2019   PT End of Session - 12/03/19 1416    Visit Number 4    Number of Visits 12    Date for PT Re-Evaluation 12/03/19    Authorization Type BCBS    Authorization Time Period --    PT Start Time 1413    PT Stop Time 6803    PT Time Calculation (min) 32 min           Past Medical History:  Diagnosis Date  . ADHD    per patient   . Eczema   . Recurrent urticaria 05/09/2016    History reviewed. No pertinent surgical history.  There were no vitals filed for this visit.   Subjective Assessment - 12/03/19 1414    Subjective I did get a knee brace and it is helping.  Rt knee pain 3/10. the note for work had to be from a MD.    Currently in Pain? Yes    Pain Score 3               [facility] Adult PT Treatment/Exercise - 12/03/19 0001      Pilates   Pilates Reformer Footwork 2 Red 1 blue parallel heels arch and toes , then in "V" .  WIde knees felt better for her , cues to avoid avoiding hyperextension in bilateral knees but also VMO engagement.     Other Pilates single leg 2 red 1 blue focus on knee and ankle aligment.  Adductors weak.       Knee/Hip Exercises: Supine   Straight Leg Raise with External Rotation Strengthening;Both;1 set;15 reps    Straight Leg Raise with External Rotation Limitations VMO     Other Supine Knee/Hip Exercises 4 way SLR       Manual Therapy   McConnell declined today                   PT Education - 12/03/19 1929    Education Details hip strength, Pilates, knee control    Person(s) Educated Patient    Methods Explanation;Handout    Comprehension Verbalized understanding                PT Long Term Goals - 11/26/19 1428      PT LONG TERM GOAL #1   Title Patient will show improved standing posture when cued due to better proprioception in joints    Status On-going      PT LONG TERM GOAL #2   Title Patient will demonstrate independent use of home exercise program to facilitate ability to maintain/progress functional gains from skilled physical therapy services.    Status On-going      PT LONG TERM GOAL #3   Title Pt will be able to work for 4 hours with pain no more than moderate 4/10-5/10 in back, hips and knees.    Status On-going      PT LONG TERM GOAL #4   Title Pt will be able to report min to no pain ascending and descending stairs in her home and at work    Status Revised      PT LONG TERM GOAL #5   Title Pt will be able to walk  without her cane for 3 weeks consecutively due to less pain overall    Baseline uses 2 times per week    Status On-going                 Plan - 12/03/19 1421    Clinical Impression Statement Shortened session, patient late.  Worked on hip and core strength, knee control using Reformer.  No increased pain. She showed poor knee control with single leg work, weakness in hips evident. Seems to be benefitting from bracing at work.    PT Treatment/Interventions ADLs/Self Care Home Management;Electrical Stimulation;Iontophoresis 45m/ml Dexamethasone;Moist Heat;Traction;Balance training;Therapeutic exercise;Therapeutic activities;Functional mobility training;Stair training;Gait training;Ultrasound;Neuromuscular re-education;Patient/family education;Manual techniques;Taping;Dry needling;Passive range of motion;Joint Manipulations;Spinal Manipulations;Cryotherapy    PT Next Visit Plan core stabilization, knee control in standing, Reformer    PT Home Exercise Plan stabilization: bent knee fall out, ER/IR red band, Tr A, SL clam red, hip adduction    Consulted and Agree with Plan of Care Patient           Patient will  benefit from skilled therapeutic intervention in order to improve the following deficits and impairments:  Hypermobility, Postural dysfunction, Pain, Abnormal gait, Decreased endurance, Decreased activity tolerance, Decreased strength, Impaired UE functional use, Decreased balance, Difficulty walking, Decreased coordination, Impaired flexibility, Decreased mobility, Obesity, Dizziness  Visit Diagnosis: Hypermobility syndrome  Pain in left leg  Pain in right leg  Muscle weakness (generalized)  Abnormal posture  Pain in joints of left hand  Pain in joints of right hand  Difficulty in walking, not elsewhere classified     Problem List Patient Active Problem List   Diagnosis Date Noted  . Hypermobility syndrome 10/19/2019  . 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 Acid reflux 06/20/2016  . Recurrent urticaria 05/09/2016  . Anxiety and depression 10/13/2014  . IBS (irritable bowel syndrome) 10/13/2014    Jill Spencer 12/03/2019, 7:34 PM  [place] [address]Phone: [phone]  Fax:  [phone] Name: Jill COLLANTESMRN: [id number] of Birth: 305-13-2002 JRaeford Razor PT 12/03/19 7:35 PM Phone: [phone]: [phone] PHYSICAL THERAPY DISCHARGE SUMMARY  Visits from Start of Care: 4  Current functional level related to goals / functional outcomes: See above    Remaining deficits: Joint pain and instability    Education / Equipment: HEP, core, self care  Plan: Patient agrees to discharge.  Patient goals were not met. Patient is being discharged due to not returning since the last visit.  ?????    JRaeford Razor PT 12/31/19 12:38 PM Phone: [phone]: [phone]

## 2019-12-05 ENCOUNTER — Ambulatory Visit: Payer: BC Managed Care – PPO | Admitting: Physical Therapy

## 2019-12-06 ENCOUNTER — Other Ambulatory Visit: Payer: Self-pay

## 2019-12-06 ENCOUNTER — Encounter: Payer: Self-pay | Admitting: Family Medicine

## 2019-12-06 ENCOUNTER — Ambulatory Visit: Payer: BC Managed Care – PPO | Admitting: Family Medicine

## 2019-12-06 VITALS — BP 100/82 | HR 88 | Temp 98.0°F | Ht 66.0 in | Wt 218.3 lb

## 2019-12-06 DIAGNOSIS — D509 Iron deficiency anemia, unspecified: Secondary | ICD-10-CM

## 2019-12-06 DIAGNOSIS — N62 Hypertrophy of breast: Secondary | ICD-10-CM | POA: Diagnosis not present

## 2019-12-06 DIAGNOSIS — E559 Vitamin D deficiency, unspecified: Secondary | ICD-10-CM

## 2019-12-06 DIAGNOSIS — M357 Hypermobility syndrome: Secondary | ICD-10-CM | POA: Diagnosis not present

## 2019-12-06 NOTE — Patient Instructions (Signed)
Consider riboflavin 400mg  daily. Takes a few months to help prevent migraines, but has data for migraine prevention.

## 2019-12-06 NOTE — Progress Notes (Signed)
Jill Spencer DOB: [birth year] Encounter date: 12/06/2019  This is a 19 y.o. female who presents with Chief Complaint  Patient presents with  . Letter for School/Work    History of present illness: Used to get migraines worse about 4 years ago, and now back. Have been back for last 2-4 weeks. Might be allergy related. Excedrin helps with migraine. Gets them 1-2 times/week. There in morning. excedrin does not always take away completely. If she combos tylenol and ibuprofen this helps. Lack of caffeine will trigger.   Working at [employer] and needs note to be able to sit down. She is active in PT and does feel like it is helping. Right knee bothers her if standing long time and hips bother her. Can stand for a few hours before bothering her.   Iron deficiency - always craving beef. She is taking iron supplement.   Interested in breast removal. Used to be covered through insurance, but she isn't sure now.  She would like to make transition to more neutral gender.  Large breasts have always bothered her.  States that breast size is a 38J.  Breast size is not decreased with weight loss in the past, even up to 20 pounds.  She does feel the large breasts contribute to her neck and shoulder pain.  Struggles with ongoing joint pain due to hypermobile joints.  She would like to meet with a geneticist to confirm diagnosis of Ehlers-Danlos.  She would like a referral for this today.  She is also interested in splinting.  She is read about hand splinting, and feels this may be helpful for her.  She does wear knee brace sometimes for the right knee which helps with pain.  She tends to get joint pain standing for prolonged periods of time.  She feels the joints are always hurting to some degree, whether his knees, ankles, wrists, fingers, neck.  Allergies of been bothering her.  Feels like she cannot get relief and wonders if headaches have some relation.  She is taking antihistamines and Flonase on a  regular basis.  She has had allergy testing in the past, and was positive for dust, roaches, shellfish/seafood.   Allergies  Allergen Reactions  . Dust Mite Extract   . Latex Hives  . Nickel Hives and Itching  . Shellfish Allergy   . Sulfa Antibiotics Other (See Comments)    Flu like sx including fever   Current Meds  Medication Sig  . amphetamine-dextroamphetamine (ADDERALL XR) 20 MG 24 hr capsule Take 1 capsule (20 mg total) by mouth every morning.  . cetirizine (ZYRTEC) 10 MG tablet Take 10 mg by mouth daily as needed for allergies.  Marland Kitchen escitalopram (LEXAPRO) 20 MG tablet Take 1 tablet (20 mg total) by mouth daily.  . ferrous sulfate 325 (65 FE) MG tablet Take 1 tablet (325 mg total) by mouth every evening.  . fluticasone (FLONASE) 50 MCG/ACT nasal spray PLACE 2 SPRAYS INTO BOTH NOSTRILS DAILY AS NEEDED FOR ALLERGIES OR RHINITIS.  Marland Kitchen SODIUM FLUORIDE 5000 PPM 1.1 % PSTE Place 1 application onto teeth every evening.   . traZODone (DESYREL) 50 MG tablet TAKE 0.5-1 TABLETS (25-50 MG TOTAL) BY MOUTH AT BEDTIME AS NEEDED FOR SLEEP.  Marland Kitchen triamcinolone cream (KENALOG) 0.1 % Apply 1 application topically 2 (two) times daily. (Patient taking differently: Apply 1-2 application topically 2 (two) times daily as needed (eczema flare ups). )  . [DISCONTINUED] Vitamin D, Ergocalciferol, (DRISDOL) 1.25 MG (50000 UNIT) CAPS capsule Take  1 capsule (50,000 Units total) by mouth every 7 (seven) days. (Patient taking differently: Take 50,000 Units by mouth every Wednesday. )    Review of Systems  Constitutional: Negative for chills, fatigue and fever.  Respiratory: Negative for cough, chest tightness, shortness of breath and wheezing.   Cardiovascular: Negative for chest pain, palpitations and leg swelling.    Objective:  BP 100/82 (BP Location: Right Arm, Patient Position: Sitting, Cuff Size: Large)   Pulse 88   Temp 98 F (36.7 C) (Oral)   Ht 5\' 6"  (1.676 m)   Wt 218 lb 4.8 oz (99 kg)   LMP  11/12/2019 (Exact Date)   BMI 35.23 kg/m   Weight: 218 lb 4.8 oz (99 kg)   BP Readings from Last 3 Encounters:  12/06/19 100/82  10/18/19 118/84  10/14/19 118/90   Wt Readings from Last 3 Encounters:  12/06/19 218 lb 4.8 oz (99 kg)  10/18/19 220 lb (99.8 kg)  10/14/19 223 lb 4.8 oz (101.3 kg)    Physical Exam Constitutional:      General: She is not in acute distress.    Appearance: She is well-developed.  Cardiovascular:     Rate and Rhythm: Normal rate and regular rhythm.     Heart sounds: Normal heart sounds. No murmur heard.  No friction rub.  Pulmonary:     Effort: Pulmonary effort is normal. No respiratory distress.     Breath sounds: Normal breath sounds. No wheezing or rales.  Musculoskeletal:     Right lower leg: No edema.     Left lower leg: No edema.  Neurological:     Mental Status: She is alert and oriented to person, place, and time.  Psychiatric:        Behavior: Behavior normal.     Assessment/Plan 1. Iron deficiency anemia, unspecified iron deficiency anemia type We will recheck blood work.  She is taking iron supplement. - CBC with Differential/Platelet; Future - Iron, TIBC and Ferritin Panel; Future  2. Vitamin D deficiency Taking supplement. - VITAMIN D 25 Hydroxy (Vit-D Deficiency, Fractures); Future  3. Symptomatic mammary hypertrophy I have reached out to see if there are any further steps we need to take before referring to breast surgeon.  I will refer once I find out more.  4. Hypermobility syndrome I am going to look into referral for geneticist.  In the meanwhile, I think it is good for her to establish care with her orthopedist.  Letter was written for work so that she can have a seat at her work space. - Ambulatory referral to Orthopedics    Return for i will be in touch with you regarding follow up, labs, and specialty evaluation.  Over 45 minutes were spent with the patient, discussing multiple concerns, concerns for needing  specialty evaluation, chart review, medication options for treatment of current allergy symptoms, reaching out to various specialists for referral considerations, charting.   10/16/19, MD

## 2019-12-11 ENCOUNTER — Encounter: Payer: Self-pay | Admitting: *Deleted

## 2019-12-16 ENCOUNTER — Ambulatory Visit (INDEPENDENT_AMBULATORY_CARE_PROVIDER_SITE_OTHER): Payer: BC Managed Care – PPO | Admitting: Psychology

## 2019-12-16 DIAGNOSIS — F331 Major depressive disorder, recurrent, moderate: Secondary | ICD-10-CM | POA: Diagnosis not present

## 2019-12-16 DIAGNOSIS — F902 Attention-deficit hyperactivity disorder, combined type: Secondary | ICD-10-CM

## 2019-12-16 DIAGNOSIS — F411 Generalized anxiety disorder: Secondary | ICD-10-CM

## 2019-12-31 ENCOUNTER — Ambulatory Visit (INDEPENDENT_AMBULATORY_CARE_PROVIDER_SITE_OTHER): Payer: BC Managed Care – PPO | Admitting: Psychology

## 2019-12-31 DIAGNOSIS — F411 Generalized anxiety disorder: Secondary | ICD-10-CM

## 2019-12-31 DIAGNOSIS — F331 Major depressive disorder, recurrent, moderate: Secondary | ICD-10-CM | POA: Diagnosis not present

## 2019-12-31 DIAGNOSIS — F902 Attention-deficit hyperactivity disorder, combined type: Secondary | ICD-10-CM | POA: Diagnosis not present

## 2020-01-14 ENCOUNTER — Ambulatory Visit: Payer: Self-pay | Admitting: Psychology

## 2020-01-18 IMAGING — DX DG HUMERUS 2V *R*
2 series · 2 of 2 positions shown · non-contrast
Comparison: None.

CLINICAL DATA: Distal humerus pain for 2 days.  No known injury.

EXAM:
RIGHT HUMERUS - 2+ VIEW

[humerus ap]
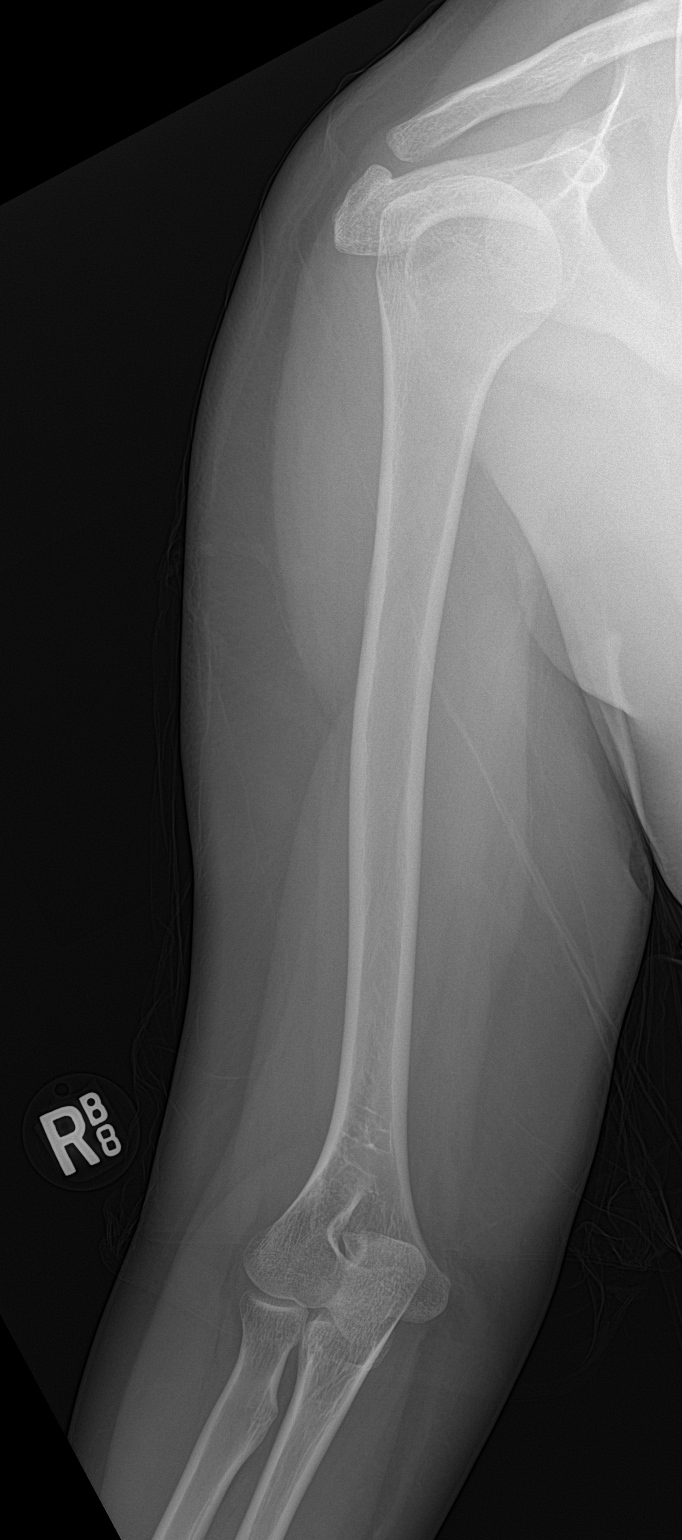

[humerus lat]
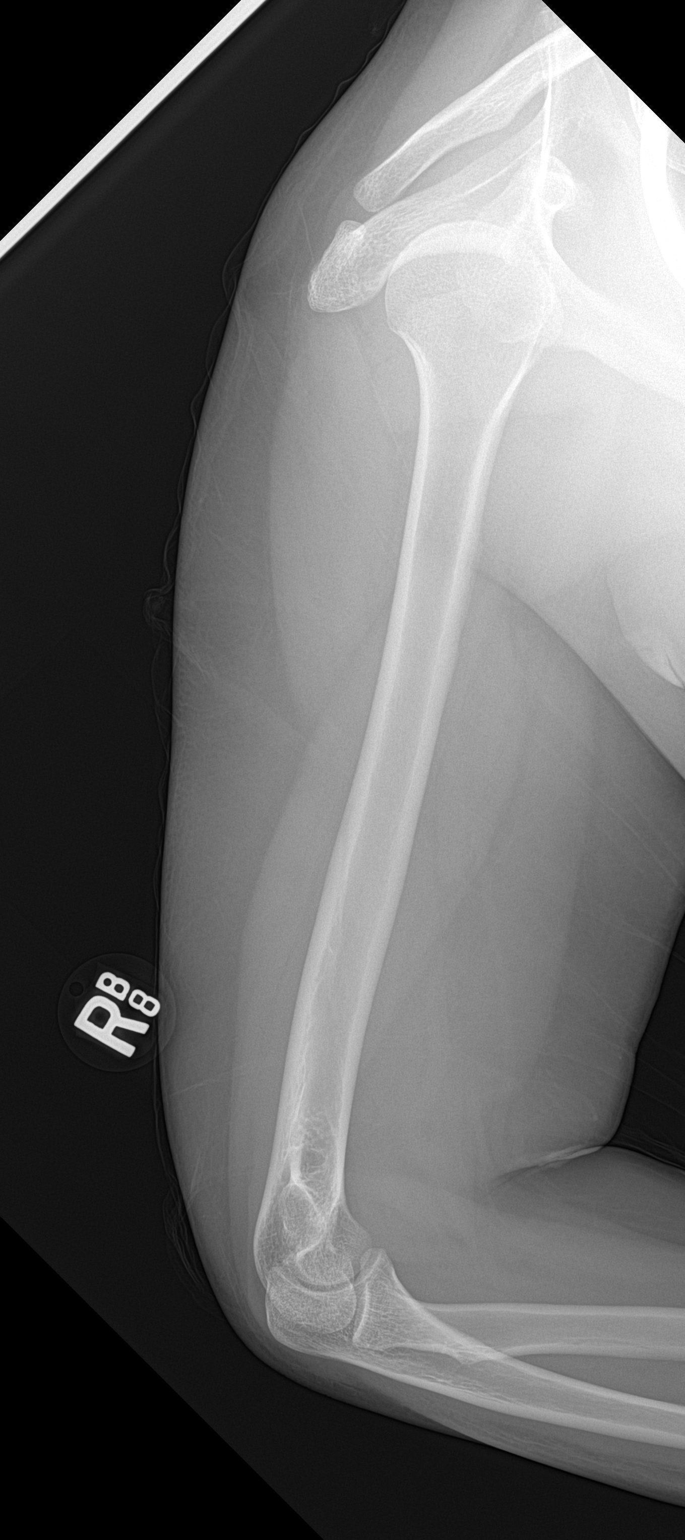

[2 of 2 positions shown; findings below may reference images not displayed]

FINDINGS: There is no evidence of fracture or other focal bone lesions. Soft
tissues are unremarkable.
IMPRESSION: Negative.

## 2020-01-20 ENCOUNTER — Other Ambulatory Visit: Payer: Self-pay | Admitting: Family Medicine

## 2020-01-20 MED ORDER — AMPHETAMINE-DEXTROAMPHET ER 20 MG PO CP24: 20.0000 mg | ORAL_CAPSULE | ORAL | 0 refills | Status: DC

## 2020-01-31 ENCOUNTER — Encounter: Payer: Self-pay | Admitting: Family Medicine

## 2020-02-01 MED ORDER — AMOXICILLIN-POT CLAVULANATE 875-125 MG PO TABS
1.0000 | ORAL_TABLET | Freq: Two times a day (BID) | ORAL | 0 refills | Status: AC
Start: 2020-02-01 — End: 2020-02-06

## 2020-03-15 ENCOUNTER — Other Ambulatory Visit: Payer: Self-pay | Admitting: Family Medicine

## 2020-03-16 MED ORDER — AMPHETAMINE-DEXTROAMPHET ER 20 MG PO CP24: 20.0000 mg | ORAL_CAPSULE | ORAL | 0 refills | Status: DC

## 2020-03-21 ENCOUNTER — Other Ambulatory Visit: Payer: Self-pay | Admitting: Internal Medicine

## 2020-03-21 DIAGNOSIS — J3089 Other allergic rhinitis: Secondary | ICD-10-CM

## 2020-04-12 ENCOUNTER — Other Ambulatory Visit: Payer: Self-pay | Admitting: Family Medicine

## 2020-04-12 DIAGNOSIS — F419 Anxiety disorder, unspecified: Secondary | ICD-10-CM

## 2020-09-04 ENCOUNTER — Other Ambulatory Visit: Payer: Self-pay | Admitting: Family Medicine

## 2020-09-04 DIAGNOSIS — F419 Anxiety disorder, unspecified: Secondary | ICD-10-CM

## 2020-09-07 MED ORDER — ESCITALOPRAM OXALATE 20 MG PO TABS: 1.0000 | ORAL_TABLET | Freq: Every day | ORAL | 0 refills | Status: DC

## 2020-09-09 MED ORDER — AMPHETAMINE-DEXTROAMPHET ER 20 MG PO CP24: 20.0000 mg | ORAL_CAPSULE | ORAL | 0 refills | Status: DC

## 2020-10-14 ENCOUNTER — Encounter: Payer: Self-pay | Admitting: Family Medicine

## 2020-10-14 ENCOUNTER — Other Ambulatory Visit: Payer: Self-pay | Admitting: Family Medicine

## 2020-10-15 ENCOUNTER — Other Ambulatory Visit: Payer: Self-pay | Admitting: Family Medicine

## 2020-10-15 DIAGNOSIS — J3089 Other allergic rhinitis: Secondary | ICD-10-CM

## 2020-10-15 MED ORDER — FLUTICASONE PROPIONATE 50 MCG/ACT NA SUSP: 2.0000 | Freq: Every day | NASAL | 5 refills | Status: AC | PRN

## 2020-10-19 NOTE — Telephone Encounter (Signed)
Routing to PCP CMA  

## 2020-10-25 MED ORDER — AMPHETAMINE-DEXTROAMPHET ER 20 MG PO CP24: 20.0000 mg | ORAL_CAPSULE | ORAL | 0 refills | Status: DC

## 2020-10-29 ENCOUNTER — Other Ambulatory Visit: Payer: Self-pay

## 2020-10-30 ENCOUNTER — Ambulatory Visit: Payer: BC Managed Care – PPO | Admitting: Family Medicine

## 2020-10-30 DIAGNOSIS — F988 Other specified behavioral and emotional disorders with onset usually occurring in childhood and adolescence: Secondary | ICD-10-CM | POA: Insufficient documentation

## 2020-10-30 NOTE — Progress Notes (Unsigned)
Jill Spencer DOB: 09-Oct-2000 Encounter date: 10/30/2020  This is a 20 y.o. female who presents for complete physical   History of present illness/Additional concerns: Last office visit with me was 10/14/2019. ***  Past Medical History:  Diagnosis Date   ADHD    per patient    Eczema    Recurrent urticaria 05/09/2016   No past surgical history on file. Allergies  Allergen Reactions   Dust Mite Extract    Latex Hives   Nickel Hives and Itching   Shellfish Allergy    Sulfa Antibiotics Other (See Comments)    Flu like sx including fever   No outpatient medications have been marked as taking for the 10/30/20 encounter (Office Visit) with Wynn Banker, MD.   Social History   Tobacco Use   Smoking status: Passive Smoke Exposure - Never Smoker   Smokeless tobacco: Never   Tobacco comments:    parents, but do not smoke in home  Substance Use Topics   Alcohol use: No    Alcohol/week: 0.0 standard drinks   Family History  Problem Relation Age of Onset   Hypertension Mother    Hyperlipidemia Mother    Cancer Maternal Grandfather        brain    Diabetes Paternal Grandmother    Congestive Heart Failure Paternal Grandmother    Hypertension Father    Arthritis Father    Eczema Paternal Uncle    Cancer Paternal Grandfather        skin, blood   Migraines Sister    Multiple sclerosis Other        paternal aunt; not entirely sure of diagnosis     Review of Systems  CBC:  Lab Results  Component Value Date   WBC 8.4 09/12/2019   HGB 10.9 (L) 09/12/2019   HCT 35.3 (L) 09/12/2019   MCH 24.8 (L) 09/12/2019   MCHC 30.9 09/12/2019   RDW 17.3 (H) 09/12/2019   PLT 219 09/12/2019   MPV 10.6 06/21/2016   CMP: Lab Results  Component Value Date   NA 137 09/12/2019   K 3.6 09/12/2019   CL 103 09/12/2019   CO2 23 09/12/2019   ANIONGAP 11 09/12/2019   GLUCOSE 111 (H) 09/12/2019   BUN 7 09/12/2019   CREATININE 0.85 09/12/2019   CREATININE 0.61 06/21/2016   GFRAA  >60 09/12/2019   CALCIUM 9.0 09/12/2019   PROT 7.6 05/14/2019   BILITOT 0.3 05/14/2019   ALKPHOS 79 05/14/2019   ALT 14 05/14/2019   AST 16 05/14/2019   LIPID: Lab Results  Component Value Date   CHOL 168 05/14/2019   TRIG (H) 05/14/2019    400.0 Triglyceride is over 400; calculations on Lipids are invalid.   HDL 36.90 (L) 05/14/2019    Objective:  There were no vitals taken for this visit.      BP Readings from Last 3 Encounters:  12/06/19 100/82  10/18/19 118/84  10/14/19 118/90   Wt Readings from Last 3 Encounters:  12/06/19 218 lb 4.8 oz (99 kg)  10/18/19 220 lb (99.8 kg)  10/14/19 223 lb 4.8 oz (101.3 kg)    Physical Exam  Assessment/Plan: Health Maintenance Due  Topic Date Due   HPV VACCINES (1 - 2-dose series) Never done   HIV Screening  Never done   Hepatitis C Screening  Never done   COVID-19 Vaccine (3 - Booster for Pfizer series) 01/02/2020   TETANUS/TDAP  01/14/2020   PAP-Cervical Cytology Screening  Never done  PAP SMEAR-Modifier  Never done   Health Maintenance reviewed - {health maintenance:315237}.  There are no diagnoses linked to this encounter.  No follow-ups on file.  Theodis Shove, MD

## 2020-12-04 ENCOUNTER — Encounter: Payer: Self-pay | Admitting: Family Medicine

## 2020-12-25 ENCOUNTER — Other Ambulatory Visit: Payer: Self-pay | Admitting: Family Medicine

## 2020-12-25 DIAGNOSIS — T7800XA Anaphylactic reaction due to unspecified food, initial encounter: Secondary | ICD-10-CM

## 2020-12-25 MED ORDER — EPINEPHRINE 0.3 MG/0.3ML IJ SOAJ: 0.3000 mg | Freq: Once | INTRAMUSCULAR | 1 refills | Status: DC

## 2020-12-27 MED ORDER — AMPHETAMINE-DEXTROAMPHET ER 20 MG PO CP24: 20.0000 mg | ORAL_CAPSULE | ORAL | 0 refills | Status: DC

## 2021-01-27 ENCOUNTER — Encounter (HOSPITAL_BASED_OUTPATIENT_CLINIC_OR_DEPARTMENT_OTHER): Payer: Self-pay

## 2021-01-27 ENCOUNTER — Emergency Department (HOSPITAL_BASED_OUTPATIENT_CLINIC_OR_DEPARTMENT_OTHER)
Admission: EM | Admit: 2021-01-27 | Discharge: 2021-01-27 | Disposition: A | Discharge status: Home / Self Care | Payer: BLUE CROSS/BLUE SHIELD | Attending: Emergency Medicine | Admitting: Emergency Medicine

## 2021-01-27 ENCOUNTER — Other Ambulatory Visit: Payer: Self-pay

## 2021-01-27 DIAGNOSIS — Z7722 Contact with and (suspected) exposure to environmental tobacco smoke (acute) (chronic): Secondary | ICD-10-CM | POA: Diagnosis not present

## 2021-01-27 DIAGNOSIS — R42 Dizziness and giddiness: Secondary | ICD-10-CM | POA: Insufficient documentation

## 2021-01-27 DIAGNOSIS — R55 Syncope and collapse: Secondary | ICD-10-CM | POA: Insufficient documentation

## 2021-01-27 DIAGNOSIS — Z91040 Latex allergy status: Secondary | ICD-10-CM | POA: Insufficient documentation

## 2021-01-27 LAB — COMPREHENSIVE METABOLIC PANEL
ALT: 13 U/L (ref 0–44)
AST: 17 U/L (ref 15–41)
Albumin: 4.1 g/dL (ref 3.5–5.0)
Alkaline Phosphatase: 68 U/L (ref 38–126)
Anion gap: 6 (ref 5–15)
BUN: 9 mg/dL (ref 6–20)
CO2: 26 mmol/L (ref 22–32)
Calcium: 8.9 mg/dL (ref 8.9–10.3)
Chloride: 104 mmol/L (ref 98–111)
Creatinine, Ser: 0.65 mg/dL (ref 0.44–1.00)
GFR, Estimated: >60 mL/min (ref >60)
Glucose, Bld: 91 mg/dL (ref 70–99)
Potassium: 3.4 mmol/L — ABNORMAL LOW (ref 3.5–5.1)
Sodium: 136 mmol/L (ref 135–145)
Total Bilirubin: 0.7 mg/dL (ref 0.3–1.2)
Total Protein: 8 g/dL (ref 6.5–8.1)

## 2021-01-27 LAB — CBC WITH DIFFERENTIAL/PLATELET
Abs Immature Granulocytes: 0.03 10*3/uL (ref 0.00–0.07)
Basophils Absolute: 0.1 10*3/uL (ref 0.0–0.1)
Basophils Relative: 1 %
Eosinophils Absolute: 0.2 10*3/uL (ref 0.0–0.5)
Eosinophils Relative: 2 %
HCT: 39.9 % (ref 36.0–46.0)
Hemoglobin: 13 g/dL (ref 12.0–15.0)
Immature Granulocytes: 0 %
Lymphocytes Relative: 28 %
Lymphs Abs: 2.4 10*3/uL (ref 0.7–4.0)
MCH: 25.6 pg — ABNORMAL LOW (ref 26.0–34.0)
MCHC: 32.6 g/dL (ref 30.0–36.0)
MCV: 78.7 fL — ABNORMAL LOW (ref 80.0–100.0)
Monocytes Absolute: 0.5 10*3/uL (ref 0.1–1.0)
Monocytes Relative: 6 %
Neutro Abs: 5.3 10*3/uL (ref 1.7–7.7)
Neutrophils Relative %: 63 %
Platelets: 294 10*3/uL (ref 150–400)
RBC: 5.07 MIL/uL (ref 3.87–5.11)
RDW: 15.5 % (ref 11.5–15.5)
WBC: 8.5 10*3/uL (ref 4.0–10.5)
nRBC: 0 % (ref 0.0–0.2)

## 2021-01-27 LAB — URINALYSIS, ROUTINE W REFLEX MICROSCOPIC
Bilirubin Urine: NEGATIVE
Glucose, UA: NEGATIVE mg/dL
Hgb urine dipstick: NEGATIVE
Ketones, ur: NEGATIVE mg/dL
Leukocytes,Ua: NEGATIVE
Nitrite: NEGATIVE
Protein, ur: NEGATIVE mg/dL
Specific Gravity, Urine: 1.025 (ref 1.005–1.030)
pH: 6 (ref 5.0–8.0)

## 2021-01-27 LAB — HCG, SERUM, QUALITATIVE: Preg, Serum: NEGATIVE

## 2021-01-27 NOTE — Discharge Instructions (Signed)
1.  Stay well-hydrated.  Move slowly when you change positions.  When you go from sitting to standing take about 10 seconds before he started to walk.  If you feel lightheaded sit back down again. 2.  Follow-up with your doctor for recheck within the next 3 to 5 days. 3.  Return to emergency department if you develop chest pain, shortness of breath, worsening or new concerning symptoms

## 2021-01-27 NOTE — ED Triage Notes (Signed)
Pt c/o dizziness, near syncope x 2 week-states she has hx of anemia and feels sx may be r/t-states she has not had PCP/blood work since may 2021-NAD-steady gait

## 2021-01-27 NOTE — ED Notes (Signed)
Pt presents with dizziness, migraine last 2 days, no headache at present time.

## 2021-01-27 NOTE — ED Provider Notes (Signed)
[facility] EMERGENCY DEPARTMENT Provider Note   CSN: [id number] Arrival date & time: 01/27/21  1854     History Chief Complaint  Patient presents with   Dizziness    Jill Spencer is a 20 y.o. female.  HPI Patient reports he been having lightheadedness and feeling like she might pass out for several months.  She reports she feels more lightheaded and dizzy when she stands up.  She reports she also sometimes feels like is hard to concentrate.  No headaches or visual changes.  Patient reports she is also noticed that she is bruising easily.  She reports at one point she was anemic and was supposed to take iron but she is not taking it currently.  She reports she has regular menstrual cycles but they are not heavy.  She is not on any birth control.  Patient denies any alcohol drug or tobacco use.  No travel history.  No recent medical procedures or treatments.  Patient is not on birth control.    Past Medical History:  Diagnosis Date   ADHD    per patient    Eczema    Recurrent urticaria 05/09/2016    Patient Active Problem List   Diagnosis Date Noted   ADD (attention deficit disorder) without hyperactivity 10/30/2020   Hypermobility syndrome 10/19/2019   Hypermobility arthralgia 03/23/2017   Acid reflux 06/20/2016   Recurrent urticaria 05/09/2016   Anxiety and depression 10/13/2014   IBS (irritable bowel syndrome) 10/13/2014    History reviewed. No pertinent surgical history.   OB History   No obstetric history on file.     Family History  Problem Relation Age of Onset   Hypertension Mother    Hyperlipidemia Mother    Cancer Maternal Grandfather        brain    Diabetes Paternal Grandmother    Congestive Heart Failure Paternal Grandmother    Hypertension Father    Arthritis Father    Eczema Paternal Uncle    Cancer Paternal Grandfather        skin, blood   Migraines Sister    Multiple sclerosis Other        paternal aunt; not entirely sure of  diagnosis    Social History   Tobacco Use   Smoking status: Never    Passive exposure: Yes   Smokeless tobacco: Never   Tobacco comments:    parents, but do not smoke in home  Vaping Use   Vaping Use: Never used  Substance Use Topics   Alcohol use: No    Alcohol/week: 0.0 standard drinks   Drug use: No    Home Medications Prior to Admission medications   Medication Sig Start Date End Date Taking? Authorizing Provider  amphetamine-dextroamphetamine (ADDERALL XR) 20 MG 24 hr capsule Take 1 capsule (20 mg total) by mouth every morning. 12/27/20   Wynn Banker, MD  cetirizine (ZYRTEC) 10 MG tablet Take 10 mg by mouth daily as needed for allergies.    [provider]  EPINEPHrine (EPIPEN 2-PAK) 0.3 mg/0.3 mL IJ SOAJ injection Inject 0.3 mg into the muscle once for 1 dose. 12/25/20 12/25/20  Wynn Banker, MD  escitalopram (LEXAPRO) 20 MG tablet Take 1 tablet (20 mg total) by mouth daily. 09/07/20   Wynn Banker, MD  ferrous sulfate 325 (65 FE) MG tablet Take 1 tablet (325 mg total) by mouth every evening. 09/25/19   Koberlein, Paris Lore, MD  fluticasone (FLONASE) 50 MCG/ACT nasal spray Place 2  sprays into both nostrils daily as needed for allergies or rhinitis. 10/15/20   Koberlein, Paris Lore, MD  SODIUM FLUORIDE 5000 PPM 1.1 % PSTE Place 1 application onto teeth every evening.  08/26/19   [provider]  traZODone (DESYREL) 50 MG tablet TAKE 0.5-1 TABLETS (25-50 MG TOTAL) BY MOUTH AT BEDTIME AS NEEDED FOR SLEEP. 11/06/19   Koberlein, Paris Lore, MD  triamcinolone cream (KENALOG) 0.1 % Apply 1 application topically 2 (two) times daily. Patient taking differently: Apply 1-2 application topically 2 (two) times daily as needed (eczema flare ups).  06/28/19   Wynn Banker, MD    Allergies    Dust mite extract, Latex, Nickel, Shellfish allergy, and Sulfa antibiotics  Review of Systems   Review of Systems 10 Systems reviewed and negative except as per  HPI Physical Exam Updated Vital Signs BP 125/87 (BP Location: Left Arm)   Pulse 79   Temp 98.6 F (37 C) (Oral)   Resp 16   Ht 5\' 6"  (1.676 m)   Wt 93.4 kg   LMP 01/06/2021 (Approximate)   SpO2 100%   BMI 33.25 kg/m   Physical Exam Constitutional:      Comments: Alert nontoxic.  No respiratory distress.  Clinically well appearance.  HENT:     Mouth/Throat:     Mouth: Mucous membranes are moist.     Pharynx: Oropharynx is clear.     Comments: Good dentition Eyes:     Extraocular Movements: Extraocular movements intact.     Conjunctiva/sclera: Conjunctivae normal.     Pupils: Pupils are equal, round, and reactive to light.  Neck:     Comments: No lymphadenopathy no thyromegaly Cardiovascular:     Rate and Rhythm: Normal rate and regular rhythm.     Pulses: Normal pulses.     Heart sounds: Normal heart sounds. No murmur heard. Pulmonary:     Effort: Pulmonary effort is normal.     Breath sounds: Normal breath sounds.  Abdominal:     General: There is no distension.     Palpations: Abdomen is soft.     Tenderness: There is no abdominal tenderness. There is no guarding.  Musculoskeletal:        General: No swelling or tenderness. Normal range of motion.     Cervical back: Neck supple.     Right lower leg: No edema.     Left lower leg: No edema.  Skin:    General: Skin is warm and dry.  Neurological:     General: No focal deficit present.     Mental Status: She is oriented to person, place, and time.     Motor: No weakness.     Coordination: Coordination normal.  Psychiatric:        Mood and Affect: Mood normal.    ED Results / Procedures / Treatments   Labs (all labs ordered are listed, but only abnormal results are displayed) Labs Reviewed  COMPREHENSIVE METABOLIC PANEL - Abnormal; Notable for the following components:      Result Value   Potassium 3.4 (*)    All other components within normal limits  CBC WITH DIFFERENTIAL/PLATELET - Abnormal; Notable for  the following components:   MCV 78.7 (*)    MCH 25.6 (*)    All other components within normal limits  URINALYSIS, ROUTINE W REFLEX MICROSCOPIC  HCG, SERUM, QUALITATIVE  TSH    EKG None  Radiology No results found.  Procedures Procedures   Medications Ordered in ED Medications -  No data to display  ED Course  I have reviewed the triage vital signs and the nursing notes.  Pertinent labs & imaging results that were available during my care of the patient were reviewed by me and considered in my medical decision making (see chart for details).    MDM Rules/Calculators/A&P                           Patient presents with complaint of lightheadedness and near syncope.  This has been an ongoing symptom for several weeks.  Patient reports history of anemia.  Today there is no evidence of anemia.  Patient is not positively orthostatic.  No chest pain, palpitations.  Patient has no DVT/PE risk factors.  Otherwise clinically well.  Denies drug or alcohol use.  Reassuring diagnostic evaluation.  Normal physical examination.  At this time stable for continued outpatient evaluation with PCP. Final Clinical Impression(s) / ED Diagnoses Final diagnoses:  Lightheadedness  Near syncope    Rx / DC Orders ED Discharge Orders     None        Arby Barrette, MD 01/27/21 2320

## 2021-01-28 LAB — TSH: TSH: 0.92 u[IU]/mL (ref 0.350–4.500)

## 2021-02-19 ENCOUNTER — Ambulatory Visit (INDEPENDENT_AMBULATORY_CARE_PROVIDER_SITE_OTHER): Payer: BLUE CROSS/BLUE SHIELD | Admitting: Family Medicine

## 2021-02-19 ENCOUNTER — Other Ambulatory Visit: Payer: Self-pay

## 2021-02-19 ENCOUNTER — Encounter: Payer: Self-pay | Admitting: Family Medicine

## 2021-02-19 VITALS — BP 116/80 | HR 80 | Temp 98.2°F | Ht 66.0 in | Wt 205.1 lb

## 2021-02-19 DIAGNOSIS — Z13220 Encounter for screening for lipoid disorders: Secondary | ICD-10-CM | POA: Diagnosis not present

## 2021-02-19 DIAGNOSIS — R233 Spontaneous ecchymoses: Secondary | ICD-10-CM

## 2021-02-19 DIAGNOSIS — R42 Dizziness and giddiness: Secondary | ICD-10-CM

## 2021-02-19 DIAGNOSIS — Z0000 Encounter for general adult medical examination without abnormal findings: Secondary | ICD-10-CM | POA: Diagnosis not present

## 2021-02-19 DIAGNOSIS — Z131 Encounter for screening for diabetes mellitus: Secondary | ICD-10-CM | POA: Diagnosis not present

## 2021-02-19 DIAGNOSIS — R5383 Other fatigue: Secondary | ICD-10-CM

## 2021-02-19 DIAGNOSIS — H01139 Eczematous dermatitis of unspecified eye, unspecified eyelid: Secondary | ICD-10-CM

## 2021-02-19 DIAGNOSIS — E611 Iron deficiency: Secondary | ICD-10-CM | POA: Diagnosis not present

## 2021-02-19 DIAGNOSIS — Z889 Allergy status to unspecified drugs, medicaments and biological substances status: Secondary | ICD-10-CM | POA: Diagnosis not present

## 2021-02-19 LAB — COMPREHENSIVE METABOLIC PANEL
ALT: 8 U/L (ref 0–35)
AST: 14 U/L (ref 0–37)
Albumin: 4 g/dL (ref 3.5–5.2)
Alkaline Phosphatase: 53 U/L (ref 39–117)
BUN: 10 mg/dL (ref 6–23)
CO2: 28 mEq/L (ref 19–32)
Calcium: 8.9 mg/dL (ref 8.4–10.5)
Chloride: 105 mEq/L (ref 96–112)
Creatinine, Ser: 0.62 mg/dL (ref 0.40–1.20)
GFR: 127.15 mL/min (ref >60.00)
Glucose, Bld: 102 mg/dL — ABNORMAL HIGH (ref 70–99)
Potassium: 3.7 mEq/L (ref 3.5–5.1)
Sodium: 139 mEq/L (ref 135–145)
Total Bilirubin: 0.7 mg/dL (ref 0.2–1.2)
Total Protein: 7.2 g/dL (ref 6.0–8.3)

## 2021-02-19 LAB — LIPID PANEL
Cholesterol: 147 mg/dL (ref 0–200)
HDL: 46.3 mg/dL (ref >39.00)
LDL Cholesterol: 74 mg/dL (ref 0–99)
NonHDL: 100.24
Total CHOL/HDL Ratio: 3
Triglycerides: 131 mg/dL (ref 0.0–149.0)
VLDL: 26.2 mg/dL (ref 0.0–40.0)

## 2021-02-19 LAB — CBC WITH DIFFERENTIAL/PLATELET
Basophils Absolute: 0.1 10*3/uL (ref 0.0–0.1)
Basophils Relative: 1 % (ref 0.0–3.0)
Eosinophils Absolute: 0.1 10*3/uL (ref 0.0–0.7)
Eosinophils Relative: 1.7 % (ref 0.0–5.0)
HCT: 37 % (ref 36.0–46.0)
Hemoglobin: 11.8 g/dL — ABNORMAL LOW (ref 12.0–15.0)
Lymphocytes Relative: 23.1 % (ref 12.0–46.0)
Lymphs Abs: 1.5 10*3/uL (ref 0.7–4.0)
MCHC: 31.9 g/dL (ref 30.0–36.0)
MCV: 78.7 fl (ref 78.0–100.0)
Monocytes Absolute: 0.4 10*3/uL (ref 0.1–1.0)
Monocytes Relative: 6.5 % (ref 3.0–12.0)
Neutro Abs: 4.3 10*3/uL (ref 1.4–7.7)
Neutrophils Relative %: 67.7 % (ref 43.0–77.0)
Platelets: 231 10*3/uL (ref 150.0–400.0)
RBC: 4.7 Mil/uL (ref 3.87–5.11)
RDW: 16.3 % — ABNORMAL HIGH (ref 11.5–15.5)
WBC: 6.4 10*3/uL (ref 4.0–10.5)

## 2021-02-19 LAB — TSH: TSH: 1.69 u[IU]/mL (ref 0.35–5.50)

## 2021-02-19 LAB — IBC + FERRITIN
Ferritin: 14.9 ng/mL (ref 10.0–291.0)
Iron: 23 ug/dL — ABNORMAL LOW (ref 42–145)
Saturation Ratios: 5.6 % — ABNORMAL LOW (ref 20.0–50.0)
TIBC: 407.4 ug/dL (ref 250.0–450.0)
Transferrin: 291 mg/dL (ref 212.0–360.0)

## 2021-02-19 MED ORDER — TACROLIMUS 0.1 % EX OINT: TOPICAL_OINTMENT | Freq: Two times a day (BID) | CUTANEOUS | 2 refills | Status: DC

## 2021-02-19 NOTE — Progress Notes (Signed)
Jill Spencer DOB: 2000/07/15 Encounter date: 02/19/2021  This is a 20 y.o. female who presents for complete physical   History of present illness/Additional concerns:  *eczema - coming back on eyelid. If she puts any lotion near eyes it burns.   *bruising badly and not sure triggers. Won't notice running into something and then will have excessive bruising. Finds bruises everywhere and doesn't feel like she "earns" them. Not had issue with bruising this much in life. No blood in stools or urine. Been worse in last couple of months. Not taking iron supplement currently. LMP was mid last month. Lasting 4 days but period was regular.   *allergies: working at [employer] right now - feels like she is constantly finding new things she gets hives with. Little things - like preservatives seem to flare her in food; even soap at works flare her. Coconut milk at [employer] started to close throat; but can drink other coconut milk.   *noted worsening of dizziness. Has always had issues with going to stand, but this was while sitting and hearing was starting to go while still sitting - about a month and a half ago.   *joints still hurt; maybe a little better. Trying to be healthier which has seemed to help (cut out soda), cut out dairy.    Past Medical History:  Diagnosis Date   ADHD    per patient    Eczema    Recurrent urticaria 05/09/2016   History reviewed. No pertinent surgical history. Allergies  Allergen Reactions   Dust Mite Extract    Latex Hives   Nickel Hives and Itching   Shellfish Allergy    Sulfa Antibiotics Other (See Comments)    Flu like sx including fever   Current Meds  Medication Sig   amphetamine-dextroamphetamine (ADDERALL XR) 20 MG 24 hr capsule Take 1 capsule (20 mg total) by mouth every morning.   cetirizine (ZYRTEC) 10 MG tablet Take 10 mg by mouth daily as needed for allergies.   escitalopram (LEXAPRO) 20 MG tablet Take 1 tablet (20 mg total) by mouth daily.    fluticasone (FLONASE) 50 MCG/ACT nasal spray Place 2 sprays into both nostrils daily as needed for allergies or rhinitis.   tacrolimus (PROTOPIC) 0.1 % ointment Apply topically 2 (two) times daily.   triamcinolone cream (KENALOG) 0.1 % Apply 1 application topically 2 (two) times daily. (Patient taking differently: Apply 1-2 application topically 2 (two) times daily as needed (eczema flare ups).)   Social History   Tobacco Use   Smoking status: Never    Passive exposure: Yes   Smokeless tobacco: Never   Tobacco comments:    parents, but do not smoke in home  Substance Use Topics   Alcohol use: No    Alcohol/week: 0.0 standard drinks   Family History  Problem Relation Age of Onset   Hypertension Mother    Hyperlipidemia Mother    Cancer Maternal Grandfather        brain    Diabetes Paternal Grandmother    Congestive Heart Failure Paternal Grandmother    Hypertension Father    Arthritis Father    Eczema Paternal Uncle    Cancer Paternal Grandfather        skin, blood   Migraines Sister    Multiple sclerosis Other        paternal aunt; not entirely sure of diagnosis     Review of Systems  Constitutional:  Positive for fatigue. Negative for activity change, appetite change, chills,  fever and unexpected weight change.  HENT:  Negative for congestion, ear pain, hearing loss, sinus pressure, sinus pain, sore throat and trouble swallowing.   Eyes:  Negative for pain and visual disturbance.  Respiratory:  Negative for cough, chest tightness, shortness of breath and wheezing.   Cardiovascular:  Negative for chest pain, palpitations and leg swelling.  Gastrointestinal:  Negative for abdominal pain, blood in stool, constipation, diarrhea, nausea and vomiting.  Genitourinary:  Negative for difficulty urinating and menstrual problem.  Musculoskeletal:  Positive for arthralgias. Negative for back pain.  Skin:  Negative for rash.  Neurological:  Negative for dizziness, weakness, numbness  and headaches.  Hematological:  Negative for adenopathy. Does not bruise/bleed easily.  Psychiatric/Behavioral:  Negative for sleep disturbance and suicidal ideas. The patient is not nervous/anxious.    CBC:  Lab Results  Component Value Date   WBC 6.4 02/19/2021   HGB 11.8 (L) 02/19/2021   HCT 37.0 02/19/2021   MCH 25.6 (L) 01/27/2021   MCHC 31.9 02/19/2021   RDW 16.3 (H) 02/19/2021   PLT 231.0 02/19/2021   MPV 10.6 06/21/2016   CMP: Lab Results  Component Value Date   NA 139 02/19/2021   K 3.7 02/19/2021   CL 105 02/19/2021   CO2 28 02/19/2021   ANIONGAP 6 01/27/2021   GLUCOSE 102 (H) 02/19/2021   BUN 10 02/19/2021   CREATININE 0.62 02/19/2021   CREATININE 0.61 06/21/2016   GFRAA >60 09/12/2019   CALCIUM 8.9 02/19/2021   PROT 7.2 02/19/2021   BILITOT 0.7 02/19/2021   ALKPHOS 53 02/19/2021   ALT 8 02/19/2021   AST 14 02/19/2021   LIPID: Lab Results  Component Value Date   CHOL 147 02/19/2021   TRIG 131.0 02/19/2021   HDL 46.30 02/19/2021   LDLCALC 74 02/19/2021    Objective:  BP 116/80 (BP Location: Left Arm, Patient Position: Sitting, Cuff Size: Normal)   Pulse 80   Temp 98.2 F (36.8 C) (Oral)   Ht 5\' 6"  (1.676 m)   Wt 205 lb 1.6 oz (93 kg)   SpO2 99%   BMI 33.10 kg/m   Weight: 205 lb 1.6 oz (93 kg)   BP Readings from Last 3 Encounters:  02/19/21 116/80  01/27/21 119/81  12/06/19 100/82   Wt Readings from Last 3 Encounters:  02/19/21 205 lb 1.6 oz (93 kg)  01/27/21 206 lb (93.4 kg)  12/06/19 218 lb 4.8 oz (99 kg)    Physical Exam Constitutional:      General: She is not in acute distress.    Appearance: She is well-developed.  HENT:     Head: Normocephalic and atraumatic.     Right Ear: External ear normal.     Left Ear: External ear normal.     Mouth/Throat:     Pharynx: No oropharyngeal exudate.  Eyes:     Conjunctiva/sclera: Conjunctivae normal.     Pupils: Pupils are equal, round, and reactive to light.  Neck:     Thyroid: No  thyromegaly.  Cardiovascular:     Rate and Rhythm: Normal rate and regular rhythm.     Heart sounds: Normal heart sounds. No murmur heard.   No friction rub. No gallop.  Pulmonary:     Effort: Pulmonary effort is normal.     Breath sounds: Normal breath sounds.  Abdominal:     General: Bowel sounds are normal. There is no distension.     Palpations: Abdomen is soft. There is no mass.  Tenderness: There is no abdominal tenderness. There is no guarding.     Hernia: No hernia is present.  Musculoskeletal:        General: No tenderness or deformity. Normal range of motion.     Cervical back: Normal range of motion and neck supple.  Lymphadenopathy:     Cervical: No cervical adenopathy.  Skin:    General: Skin is warm and dry.     Coloration: Skin is pale.     Findings: No rash.     Comments: Scaling medial left eyelid with some noted thickening of skin  Neurological:     Mental Status: She is alert and oriented to person, place, and time.     Deep Tendon Reflexes: Reflexes normal.     Reflex Scores:      Tricep reflexes are 2+ on the right side and 2+ on the left side.      Bicep reflexes are 2+ on the right side and 2+ on the left side.      Brachioradialis reflexes are 2+ on the right side and 2+ on the left side.      Patellar reflexes are 2+ on the right side and 2+ on the left side. Psychiatric:        Speech: Speech normal.        Behavior: Behavior normal.        Thought Content: Thought content normal.    Assessment/Plan: Health Maintenance Due  Topic Date Due   HPV VACCINES (1 - 2-dose series) Never done   HIV Screening  Never done   Hepatitis C Screening  Never done   TETANUS/TDAP  01/14/2020   COVID-19 Vaccine (4 - Booster for Pfizer series) 05/08/2020   PAP-Cervical Cytology Screening  Never done   PAP SMEAR-Modifier  Never done   Health Maintenance reviewed.  1. Preventative health care Keep up with regular activity. Patient declined pap today. She is  not sexually active and has not been in the past, we can consider this at future visit.   2. Eczema of eyelid, unspecified laterality Encouraged trying Vaseline or coconut oil, but also will give Protopic to help clear up the eczema. - tacrolimus (PROTOPIC) 0.1 % ointment; Apply topically 2 (two) times daily.  Dispense: 30 g; Refill: 2  3. Easy bruising Start with blood work.  Further evaluation pending these results.  4. Multiple allergies - Ambulatory referral to Allergy  5. Dizziness Start with blood work.  Suspect iron deficiency.  6. Iron deficiency - CBC with Differential/Platelet; Future - IBC + Ferritin; Future - IBC + Ferritin - CBC with Differential/Platelet  7. Lipid screening - Lipid panel; Future - Lipid panel  8. Screening for diabetes mellitus - Comprehensive metabolic panel; Future - Comprehensive metabolic panel  9. Other fatigue - TSH; Future - TSH  Return for pending bloodwork.  Theodis Shove, MD

## 2021-05-24 ENCOUNTER — Ambulatory Visit: Payer: BLUE CROSS/BLUE SHIELD | Admitting: Family Medicine

## 2021-06-04 ENCOUNTER — Ambulatory Visit: Payer: BLUE CROSS/BLUE SHIELD | Admitting: Family Medicine

## 2021-06-04 VITALS — BP 102/78 | HR 86 | Temp 98.1°F | Ht 66.0 in | Wt 214.8 lb

## 2021-06-04 DIAGNOSIS — D649 Anemia, unspecified: Secondary | ICD-10-CM | POA: Diagnosis not present

## 2021-06-04 DIAGNOSIS — F419 Anxiety disorder, unspecified: Secondary | ICD-10-CM

## 2021-06-04 DIAGNOSIS — N921 Excessive and frequent menstruation with irregular cycle: Secondary | ICD-10-CM

## 2021-06-04 DIAGNOSIS — R5383 Other fatigue: Secondary | ICD-10-CM

## 2021-06-04 MED ORDER — NORETHINDRONE ACET-ETHINYL EST 1-20 MG-MCG PO TABS: 1.0000 | ORAL_TABLET | Freq: Every day | ORAL | 11 refills | Status: DC

## 2021-06-04 MED ORDER — ESCITALOPRAM OXALATE 20 MG PO TABS: 20.0000 mg | ORAL_TABLET | Freq: Every day | ORAL | 1 refills | Status: DC

## 2021-06-04 MED ORDER — AMPHETAMINE-DEXTROAMPHET ER 20 MG PO CP24: 20.0000 mg | ORAL_CAPSULE | ORAL | 0 refills | Status: DC

## 2021-06-04 NOTE — Patient Instructions (Signed)
See how you do with the new birth control. It may take a few months to help fully regulate. Let me know if any trouble with the medication.

## 2021-06-04 NOTE — Progress Notes (Signed)
Jill Spencer DOB: 01-05-2001 Encounter date: 06/04/2021  This is a 21 y.o. female who presents with Chief Complaint  Patient presents with   Follow-up   Menstrual Problem    Patient complains of irregular periods and painful periods x8 months, denies being sexually active    History of present illness: Dizziness has gone away. Started being irregular in November. Skipped period. Periods lasting 4 days. Painful for first 2 days. States really bad. Throwing up with pain.first day heavy. Changing pad/tampon q 2 hours on first day. Will leak through if doesn't change. Coming every 3 weeks-5 weeks.   Not sexually active, hasn't been.   LMP was 2 weeks ago.   Mood hasn't been good overall. She is perpetually stressed and thinking about moving across country. Work is stressful.   Forgets to take her lexapro. Didn't get adderall through holiday season. Just forgets to call it in.   Allergies  Allergen Reactions   Dust Mite Extract    Latex Hives   Nickel Hives and Itching   Shellfish Allergy    Sulfa Antibiotics Other (See Comments)    Flu like sx including fever   Current Meds  Medication Sig   cetirizine (ZYRTEC) 10 MG tablet Take 10 mg by mouth daily as needed for allergies.   fluticasone (FLONASE) 50 MCG/ACT nasal spray Place 2 sprays into both nostrils daily as needed for allergies or rhinitis.   norethindrone-ethinyl estradiol (LOESTRIN) 1-20 MG-MCG tablet Take 1 tablet by mouth daily.   tacrolimus (PROTOPIC) 0.1 % ointment Apply topically 2 (two) times daily.   triamcinolone cream (KENALOG) 0.1 % Apply 1 application topically 2 (two) times daily. (Patient taking differently: Apply 1-2 application topically 2 (two) times daily as needed (eczema flare ups).)   [DISCONTINUED] amphetamine-dextroamphetamine (ADDERALL XR) 20 MG 24 hr capsule Take 1 capsule (20 mg total) by mouth every morning.   [DISCONTINUED] escitalopram (LEXAPRO) 20 MG tablet Take 1 tablet (20 mg total) by  mouth daily.    Review of Systems  Constitutional:  Negative for chills, fatigue and fever.  Respiratory:  Negative for cough, chest tightness, shortness of breath and wheezing.   Cardiovascular:  Negative for chest pain, palpitations and leg swelling.  Genitourinary:  Positive for menstrual problem.   Objective:  BP 102/78 (BP Location: Left Arm, Patient Position: Sitting, Cuff Size: Large)    Pulse 86    Temp 98.1 F (36.7 C) (Oral)    Ht 5\' 6"  (1.676 m)    Wt 214 lb 12.8 oz (97.4 kg)    LMP 05/21/2021 (Approximate)    SpO2 99%    BMI 34.67 kg/m   Weight: 214 lb 12.8 oz (97.4 kg)   BP Readings from Last 3 Encounters:  06/04/21 102/78  02/19/21 116/80  01/27/21 119/81   Wt Readings from Last 3 Encounters:  06/04/21 214 lb 12.8 oz (97.4 kg)  02/19/21 205 lb 1.6 oz (93 kg)  01/27/21 206 lb (93.4 kg)    Physical Exam Constitutional:      General: She is not in acute distress.    Appearance: She is well-developed.  Cardiovascular:     Rate and Rhythm: Normal rate and regular rhythm.     Heart sounds: Normal heart sounds. No murmur heard.   No friction rub.  Pulmonary:     Effort: Pulmonary effort is normal. No respiratory distress.     Breath sounds: Normal breath sounds. No wheezing or rales.  Musculoskeletal:     Right lower leg:  No edema.     Left lower leg: No edema.  Neurological:     Mental Status: She is alert and oriented to person, place, and time.  Psychiatric:        Behavior: Behavior normal.    Assessment/Plan  1. Menometrorrhagia Will start ocp for regulation and help with heaviness.  - norethindrone-ethinyl estradiol (LOESTRIN) 1-20 MG-MCG tablet; Take 1 tablet by mouth daily.  Dispense: 28 tablet; Refill: 11 - CBC with Differential/Platelet; Future  2. Fatigue, unspecified type - Vitamin B12; Future - VITAMIN D 25 Hydroxy (Vit-D Deficiency, Fractures); Future  3. Anemia, unspecified type - CBC with Differential/Platelet; Future - Vitamin B12;  Future - IBC + Ferritin; Future  4. Anxiety Restart lexapro if desired. Some of anxiety related to ADD; ok to start adderall first and assess need for restarting lexapro.  - escitalopram (LEXAPRO) 20 MG tablet; Take 1 tablet (20 mg total) by mouth daily.  Dispense: 90 tablet; Refill: 1   Return if symptoms worsen or fail to improve.  30 minutes spent with patient in chart review, exam, charting time.    Theodis Shove, MD

## 2021-07-05 ENCOUNTER — Encounter: Payer: Self-pay | Admitting: Family Medicine

## 2021-12-21 ENCOUNTER — Encounter: Payer: Self-pay | Admitting: Family Medicine

## 2021-12-21 ENCOUNTER — Ambulatory Visit: Payer: BLUE CROSS/BLUE SHIELD | Admitting: Family Medicine

## 2021-12-21 VITALS — BP 116/80 | HR 74 | Temp 97.6°F | Ht 66.5 in | Wt 221.8 lb

## 2021-12-21 DIAGNOSIS — F988 Other specified behavioral and emotional disorders with onset usually occurring in childhood and adolescence: Secondary | ICD-10-CM | POA: Diagnosis not present

## 2021-12-21 DIAGNOSIS — Z23 Encounter for immunization: Secondary | ICD-10-CM

## 2021-12-21 DIAGNOSIS — N921 Excessive and frequent menstruation with irregular cycle: Secondary | ICD-10-CM

## 2021-12-21 DIAGNOSIS — F1590 Other stimulant use, unspecified, uncomplicated: Secondary | ICD-10-CM | POA: Diagnosis not present

## 2021-12-21 MED ORDER — AMPHETAMINE-DEXTROAMPHET ER 20 MG PO CP24: 20.0000 mg | ORAL_CAPSULE | ORAL | 0 refills | Status: DC

## 2021-12-21 MED ORDER — NORETHINDRONE ACET-ETHINYL EST 1-20 MG-MCG PO TABS: 1.0000 | ORAL_TABLET | Freq: Every day | ORAL | 11 refills | Status: DC

## 2021-12-21 NOTE — Progress Notes (Signed)
Assessment/Plan:   Problem List Items Addressed This Visit       Other   ADD (attention deficit disorder) without hyperactivity    Currently unstable as patient is on medication, We will restart Adderall XR 20 mg daily, 3 months prescription provided Controlled substance contract issued Annual UDS ordered      Relevant Medications   amphetamine-dextroamphetamine (ADDERALL XR) 20 MG 24 hr capsule   Other Visit Diagnoses     Need for tetanus booster    -  Primary   Relevant Orders   Tdap vaccine greater than or equal to 7yo IM (Completed)   Menometrorrhagia       Relevant Medications   norethindrone-ethinyl estradiol (LOESTRIN) 1-20 MG-MCG tablet   Amphetamine use       Relevant Medications   amphetamine-dextroamphetamine (ADDERALL XR) 20 MG 24 hr capsule   amphetamine-dextroamphetamine (ADDERALL XR) 20 MG 24 hr capsule   Other Relevant Orders   ToxASSURE Select 13 (MW), Urine          Subjective:  HPI:  Jill Spencer is a 21 y.o. female who has Anxiety and depression; IBS (irritable bowel syndrome); Recurrent urticaria; Acid reflux; Hypermobility arthralgia; Hypermobility syndrome; and ADD (attention deficit disorder) without hyperactivity on their problem list..   She  has a past medical history of ADHD, Eczema, and Recurrent urticaria (05/09/2016)..   She presents with chief complaint of Establish Care (Rx refill adderall previous doctor moved to [place]. Declined OBGYN referral .) .    ADHD. Worthy Flank Wheelwright presents with follow up for ADHD. she complains of the following ADHD symptoms: inattention and the following cardiac symptoms: none. she denies the following ADHD symptoms: aggressive behavior and dependence on supervision and the following cardiac symptoms: chest pain, dyspnea, irregular heart beat, palpitations, and syncope. she has taken takes Pyschostimulants: Adderall XR 20 mg, last prescription provided in 01/2021.  Patient is returning to school.   She also works full-time at [employer], feels that ADHD no significant medication side effects noted and well controlled when taking medication.   Patient states she needs refill on her birth control.  She has history of regular menses. Adult ADHD Self Report Scale (most recent)     Adult ADHD Self-Report Scale (ASRS-v1.1) Symptom Checklist   No documentation.           History reviewed. No pertinent surgical history.  Outpatient Medications Prior to Visit  Medication Sig Dispense Refill   cetirizine (ZYRTEC) 10 MG tablet Take 10 mg by mouth daily as needed for allergies.     fluticasone (FLONASE) 50 MCG/ACT nasal spray Place 2 sprays into both nostrils daily as needed for allergies or rhinitis. 16 mL 5   amphetamine-dextroamphetamine (ADDERALL XR) 20 MG 24 hr capsule Take 1 capsule (20 mg total) by mouth every morning. 30 capsule 0   escitalopram (LEXAPRO) 20 MG tablet Take 1 tablet (20 mg total) by mouth daily. 90 tablet 1   norethindrone-ethinyl estradiol (LOESTRIN) 1-20 MG-MCG tablet Take 1 tablet by mouth daily. 28 tablet 11   tacrolimus (PROTOPIC) 0.1 % ointment Apply topically 2 (two) times daily. 30 g 2   triamcinolone cream (KENALOG) 0.1 % Apply 1 application topically 2 (two) times daily. (Patient taking differently: Apply 1-2 application  topically 2 (two) times daily as needed (eczema flare ups).) 30 g 0   EPINEPHrine (EPIPEN 2-PAK) 0.3 mg/0.3 mL IJ SOAJ injection Inject 0.3 mg into the muscle once for 1 dose. 2 each 1  No facility-administered medications prior to visit.    Family History  Problem Relation Age of Onset   Hypertension Mother    Hyperlipidemia Mother    Cancer Maternal Grandfather        brain    Diabetes Paternal Grandmother    Congestive Heart Failure Paternal Grandmother    Hypertension Father    Arthritis Father    Eczema Paternal Uncle    Cancer Paternal Grandfather        skin, blood   Migraines Sister    Multiple sclerosis Other         paternal aunt; not entirely sure of diagnosis    Social History   Socioeconomic History   Marital status: Single    Spouse name: Not on file   Number of children: Not on file   Years of education: Not on file   Highest education level: Some college, no degree  Occupational History   Not on file  Tobacco Use   Smoking status: Never    Passive exposure: Yes   Smokeless tobacco: Never   Tobacco comments:    parents, but do not smoke in home  Vaping Use   Vaping Use: Never used  Substance and Sexual Activity   Alcohol use: No    Alcohol/week: 0.0 standard drinks of alcohol   Drug use: No   Sexual activity: Never    Birth control/protection: None  Other Topics Concern   Not on file  Social History Narrative   Not on file   Social Determinants of Health   Financial Resource Strain: Low Risk  (06/04/2021)   Overall Financial Resource Strain (CARDIA)    Difficulty of Paying Living Expenses: Not very hard  Food Insecurity: No Food Insecurity (06/04/2021)   Hunger Vital Sign    Worried About Running Out of Food in the Last Year: Never true    Ran Out of Food in the Last Year: Never true  Transportation Needs: No Transportation Needs (06/04/2021)   PRAPARE - Administrator, Civil Service (Medical): No    Lack of Transportation (Non-Medical): No  Physical Activity: Insufficiently Active (06/04/2021)   Exercise Vital Sign    Days of Exercise per Week: 2 days    Minutes of Exercise per Session: 40 min  Stress: Stress Concern Present (06/04/2021)   [employer] of Occupational Health - Occupational Stress Questionnaire    Feeling of Stress : Very much  Social Connections: Socially Isolated (06/04/2021)   Social Connection and Isolation Panel [NHANES]    Frequency of Communication with Friends and Family: Twice a week    Frequency of Social Gatherings with Friends and Family: Once a week    Attends Religious Services: Never    Database administrator or  Organizations: No    Attends Engineer, structural: Not on file    Marital Status: Never married  Intimate Partner Violence: Not on file                                                                                                 Objective:  Physical Exam: BP 116/80 (BP Location: Left Arm, Patient Position: Sitting, Cuff Size: Large)   Pulse 74   Temp 97.6 F (36.4 C) (Temporal)   Ht 5' 6.5" (1.689 m)   Wt 221 lb 12.8 oz (100.6 kg)   LMP 12/14/2021   SpO2 98%   BMI 35.26 kg/m    General: No acute distress. Awake and conversant.  Eyes: Normal conjunctiva, anicteric. Round symmetric pupils.  ENT: Hearing grossly intact. No nasal discharge.  Neck: Neck is supple. No masses or thyromegaly.  Respiratory: Respirations are non-labored. No auditory wheezing.  CTA B Skin: Warm. No rashes or ulcers.  Psych: Alert and oriented. Cooperative, Appropriate mood and affect, Normal judgment.  CV: No cyanosis or JVD, RRR, no MRG MSK: Normal ambulation. No clubbing  Neuro: Sensation and CN II-XII grossly normal.        Garner Nash, MD, MS

## 2021-12-21 NOTE — Patient Instructions (Signed)
We refilled your Adderall XR

## 2021-12-21 NOTE — Assessment & Plan Note (Signed)
Currently unstable as patient is on medication, We will restart Adderall XR 20 mg daily, 3 months prescription provided Controlled substance contract issued Annual UDS ordered

## 2021-12-24 LAB — TOXASSURE SELECT 13 (MW), URINE

## 2022-03-28 ENCOUNTER — Ambulatory Visit: Payer: BLUE CROSS/BLUE SHIELD | Admitting: Internal Medicine

## 2022-05-25 ENCOUNTER — Other Ambulatory Visit: Payer: Self-pay | Admitting: Family Medicine

## 2022-05-25 DIAGNOSIS — F1590 Other stimulant use, unspecified, uncomplicated: Secondary | ICD-10-CM

## 2022-05-30 ENCOUNTER — Ambulatory Visit: Payer: 59 | Admitting: Family Medicine

## 2022-05-30 ENCOUNTER — Telehealth: Payer: Self-pay | Admitting: Family Medicine

## 2022-05-30 NOTE — Telephone Encounter (Signed)
error 

## 2022-06-14 ENCOUNTER — Encounter: Payer: Self-pay | Admitting: Family Medicine

## 2022-06-14 ENCOUNTER — Ambulatory Visit: Payer: 59 | Admitting: Family Medicine

## 2022-06-14 VITALS — BP 120/78 | HR 93 | Temp 97.6°F | Wt 222.8 lb

## 2022-06-14 DIAGNOSIS — N62 Hypertrophy of breast: Secondary | ICD-10-CM | POA: Insufficient documentation

## 2022-06-14 DIAGNOSIS — F321 Major depressive disorder, single episode, moderate: Secondary | ICD-10-CM | POA: Diagnosis not present

## 2022-06-14 DIAGNOSIS — G4700 Insomnia, unspecified: Secondary | ICD-10-CM | POA: Diagnosis not present

## 2022-06-14 DIAGNOSIS — F419 Anxiety disorder, unspecified: Secondary | ICD-10-CM

## 2022-06-14 DIAGNOSIS — F988 Other specified behavioral and emotional disorders with onset usually occurring in childhood and adolescence: Secondary | ICD-10-CM

## 2022-06-14 MED ORDER — DOXEPIN HCL 10 MG PO CAPS: 10.0000 mg | ORAL_CAPSULE | Freq: Every day | ORAL | 0 refills | Status: DC

## 2022-06-14 MED ORDER — AMPHETAMINE-DEXTROAMPHET ER 20 MG PO CP24: 20.0000 mg | ORAL_CAPSULE | ORAL | 0 refills | Status: DC

## 2022-06-14 NOTE — Assessment & Plan Note (Signed)
Patient scores on PHQ-9 and GAD-7 suggest moderate levels of depression and anxiety, respectively.  Differential diagnosis:  Primary mood and anxiety disorder Mood and anxiety symptoms secondary to chronic stress from ADHD  Plan:  Discuss therapy options for anxiety and depression, with consideration given to the inconsistency in taking medications. Suggest trial of Doxepin 10 mg at night to assist with sleep Evaluate sleep hygiene and consider behavioral interventions for insomnia. Arrange a follow-up appointment in one month to assess mood, anxiety symptoms, sleep quality, and response to Doxepin.

## 2022-06-14 NOTE — Patient Instructions (Signed)
Restart Adderall and doxepin as prescribed

## 2022-06-14 NOTE — Progress Notes (Signed)
Assessment/Plan:   Problem List Items Addressed This Visit       Other   ADD (attention deficit disorder) without hyperactivity    The patient continues to display symptoms consistent with ADHD and has difficulty with consistency in taking prescribed medication. The ADHD self-report scale indicates the persistence of symptoms.  Plan:  Continue Adderall XR 20 mg. Reinforce the importance of daily adherence to the medication regimen. Consider options for more consistent medication management techniques. Monitor for side effects, particularly any sleep disturbances. Schedule follow-up in one month to assess effectiveness and adherence to treatment.      Relevant Medications   amphetamine-dextroamphetamine (ADDERALL XR) 20 MG 24 hr capsule   Anxiety   Relevant Medications   doxepin (SINEQUAN) 10 MG capsule   Current moderate episode of major depressive disorder ([place])    Patient scores on PHQ-9 and GAD-7 suggest moderate levels of depression and anxiety, respectively.  Differential diagnosis:  Primary mood and anxiety disorder Mood and anxiety symptoms secondary to chronic stress from ADHD  Plan:  Discuss therapy options for anxiety and depression, with consideration given to the inconsistency in taking medications. Suggest trial of Doxepin 10 mg at night to assist with sleep Evaluate sleep hygiene and consider behavioral interventions for insomnia. Arrange a follow-up appointment in one month to assess mood, anxiety symptoms, sleep quality, and response to Doxepin.      Relevant Medications   doxepin (SINEQUAN) 10 MG capsule   Macromastia - Primary    Patient experiences back pain likely related to macromastia, leading to chronic discomfort and impact on quality of life.  Refer to a breast surgeon to discuss potential surgical intervention. Continue conservative management for back pain, including proper support garments and OTC pain medication as needed. Encourage  maintenance of good posture and ergonomics.      Relevant Orders   Ambulatory Referral for Breast Surgery   Other Visit Diagnoses     Insomnia, unspecified type       Relevant Medications   doxepin (SINEQUAN) 10 MG capsule       Medications Discontinued During This Encounter  Medication Reason   amphetamine-dextroamphetamine (ADDERALL XR) 20 MG 24 hr capsule    amphetamine-dextroamphetamine (ADDERALL XR) 20 MG 24 hr capsule    amphetamine-dextroamphetamine (ADDERALL XR) 20 MG 24 hr capsule Reorder      Subjective:  HPI: Encounter date: 06/14/2022  Jill Spencer is a 22 y.o. female who has Anxiety and depression; IBS (irritable bowel syndrome); Recurrent urticaria; Acid reflux; Hypermobility arthralgia; Hypermobility syndrome; ADD (attention deficit disorder) without hyperactivity; Anxiety; Current moderate episode of major depressive disorder ([place]); and Macromastia on their problem list..   She  has a past medical history of ADHD, Eczema, and Recurrent urticaria (05/09/2016)..   CHIEF COMPLAINT: Follow-up on ADHD and initiation of paperwork for breast reduction due to back pain.  HISTORY OF PRESENT ILLNESS:  ADHD: Patient is currently prescribed Adderall XR 20 mg; however, the patient reports inconsistency in taking the medication, which they have filled once about 6 months ago. The patient acknowledges it helps with concentration and academic work. They are studying digital media literacy. An ADHD self-report scale was performed with similar results to the previous score. No adverse reactions like chest pain or shortness of breath reported, but the patient mentions occasional insomnia.  Breast Reduction: Female patient reports significant back pain associated with large breast size. History of upper mid back pain for approximately 6 years, with a bra size of J.  The patient has made lifestyle modifications, such as posture improvement and wearing a counterweight backpack, and  taken OTC pain relievers without adequate pain relief.  Anxiety/Depression: Patient reports history of anxiety and depression with a PHQ-9 score of 12 and a GAD-7 score of 10, indicating moderate symptoms. They have a history of taking Lexapro inconsistently and report variable mood when on ADHD medication.  ROS:  General: Back pain associated with breast size MSK: Attempted posture improvement without relief Psych: ADHD symptoms, difficulty with memory and organization, and history of anxiety and depression. Sleep: Insomnia and difficulty unwinding  Adult ADHD Self Report Scale (most recent)     Adult ADHD Self-Report Scale (ASRS-v1.1) Symptom Checklist - 06/14/22 0908       Part A   1. How often do you have trouble wrapping up the final details of a project, once the challenging parts have been done? Very Often  2. How often do you have difficulty getting things done in order when you have to do a task that requires organization? Sometimes    3. How often do you have problems remembering appointments or obligations? Very Often  4. When you have a task that requires a lot of thought, how often do you avoid or delay getting started? Sometimes    5. How often do you fidget or squirm with your hands or feet when you have to sit down for a long time? Often  6. How often do you feel overly active and compelled to do things, like you were driven by a motor? Sometimes      Part B   7. How often do you make careless mistakes when you have to work on a boring or difficult project? Often  8. How often do you have difficulty keeping your attention when you are doing boring or repetitive work? Sometimes    9. How often do you have difficulty concentrating on what people say to you, even when they are speaking to you directly? Often  10. How often do you misplace or have difficulty finding things at home or at work? Very Often    49. How often are you distracted by activity or noise around you? Sometimes   12. How often do you leave your seat in meetings or other situations in which you are expected to remain seated? Never    13. How often do you feel restless or fidgety? Often  14. How often do you have difficulty unwinding and relaxing when you have time to yourself? Very Often    58. How often do you find yourself talking too much when you are in social situations? Very Often  1. When you are in a conversation, how often do you find yourself finishing the sentences of the people you are talking to, before they can finish them themselves? Sometimes    17. How often do you have difficulty waiting your turn in situations when turn taking is required? Rarely  18. How often do you interrupt others when they are busy? Rarely      Comment   How old were you when these problems first began to occur? 4               No past surgical history on file.  Outpatient Medications Prior to Visit  Medication Sig Dispense Refill   cetirizine (ZYRTEC) 10 MG tablet Take 10 mg by mouth daily as needed for allergies.     fluticasone (FLONASE) 50 MCG/ACT nasal spray Place 2  sprays into both nostrils daily as needed for allergies or rhinitis. 16 mL 5   amphetamine-dextroamphetamine (ADDERALL XR) 20 MG 24 hr capsule Take 1 capsule (20 mg total) by mouth every morning. 30 capsule 0   amphetamine-dextroamphetamine (ADDERALL XR) 20 MG 24 hr capsule Take 1 capsule (20 mg total) by mouth every morning. 30 capsule 0   amphetamine-dextroamphetamine (ADDERALL XR) 20 MG 24 hr capsule Take 1 capsule (20 mg total) by mouth every morning. 30 capsule 0   EPINEPHrine (EPIPEN 2-PAK) 0.3 mg/0.3 mL IJ SOAJ injection Inject 0.3 mg into the muscle once for 1 dose. 2 each 1   norethindrone-ethinyl estradiol (LOESTRIN) 1-20 MG-MCG tablet Take 1 tablet by mouth daily. (Patient not taking: Reported on 06/14/2022) 28 tablet 11   No facility-administered medications prior to visit.    Family History  Problem Relation Age of Onset    Hypertension Mother    Hyperlipidemia Mother    Cancer Maternal Grandfather        brain    Diabetes Paternal Grandmother    Congestive Heart Failure Paternal Grandmother    Hypertension Father    Arthritis Father    Eczema Paternal Uncle    Cancer Paternal Grandfather        skin, blood   Migraines Sister    Multiple sclerosis Other        paternal aunt; not entirely sure of diagnosis    Social History   Socioeconomic History   Marital status: Single    Spouse name: Not on file   Number of children: Not on file   Years of education: Not on file   Highest education level: Some college, no degree  Occupational History   Not on file  Tobacco Use   Smoking status: Never    Passive exposure: Yes   Smokeless tobacco: Never   Tobacco comments:    parents, but do not smoke in home  Vaping Use   Vaping Use: Never used  Substance and Sexual Activity   Alcohol use: No    Alcohol/week: 0.0 standard drinks of alcohol   Drug use: No   Sexual activity: Never    Birth control/protection: None  Other Topics Concern   Not on file  Social History Narrative   Not on file   Social Determinants of Health   Financial Resource Strain: Low Risk  (06/04/2021)   Overall Financial Resource Strain (CARDIA)    Difficulty of Paying Living Expenses: Not very hard  Food Insecurity: No Food Insecurity (06/04/2021)   Hunger Vital Sign    Worried About Running Out of Food in the Last Year: Never true    Ran Out of Food in the Last Year: Never true  Transportation Needs: No Transportation Needs (06/04/2021)   PRAPARE - Hydrologist (Medical): No    Lack of Transportation (Non-Medical): No  Physical Activity: Insufficiently Active (06/04/2021)   Exercise Vital Sign    Days of Exercise per Week: 2 days    Minutes of Exercise per Session: 40 min  Stress: Stress Concern Present (06/04/2021)   [place]     Feeling of Stress : Very much  Social Connections: Socially Isolated (06/04/2021)   Social Connection and Isolation Panel [NHANES]    Frequency of Communication with Friends and Family: Twice a week    Frequency of Social Gatherings with Friends and Family: Once a week    Attends Religious Services: Never  Active Member of Clubs or Organizations: No    Attends Music therapist: Not on file    Marital Status: Never married  Intimate Partner Violence: Not on file                                                                                                 Objective:  Physical Exam: BP 120/78 (BP Location: Left Arm, Patient Position: Sitting, Cuff Size: Large)   Pulse 93   Temp 97.6 F (36.4 C) (Temporal)   Wt 222 lb 12.8 oz (101.1 kg)   LMP 05/31/2022   SpO2 100%   BMI 35.42 kg/m    Gen: NAD, resting comfortably CV: RRR with no murmurs appreciated Pulm: NWOB, CTAB with no crackles, wheezes, or rhonchi GI: Normal bowel sounds present. Soft, Nontender, Nondistended. MSK: no edema, cyanosis, or clubbing noted Skin: warm, dry Neuro: grossly normal, moves all extremities Psych: Normal affect and thought content      Alesia Banda, MD, MS

## 2022-06-14 NOTE — Assessment & Plan Note (Signed)
The patient continues to display symptoms consistent with ADHD and has difficulty with consistency in taking prescribed medication. The ADHD self-report scale indicates the persistence of symptoms.  Plan:  Continue Adderall XR 20 mg. Reinforce the importance of daily adherence to the medication regimen. Consider options for more consistent medication management techniques. Monitor for side effects, particularly any sleep disturbances. Schedule follow-up in one month to assess effectiveness and adherence to treatment.

## 2022-06-14 NOTE — Assessment & Plan Note (Signed)
Patient experiences back pain likely related to macromastia, leading to chronic discomfort and impact on quality of life.  Refer to a breast surgeon to discuss potential surgical intervention. Continue conservative management for back pain, including proper support garments and OTC pain medication as needed. Encourage maintenance of good posture and ergonomics.

## 2022-07-08 ENCOUNTER — Other Ambulatory Visit: Payer: Self-pay | Admitting: Family Medicine

## 2022-07-08 DIAGNOSIS — F321 Major depressive disorder, single episode, moderate: Secondary | ICD-10-CM

## 2022-07-08 DIAGNOSIS — F419 Anxiety disorder, unspecified: Secondary | ICD-10-CM

## 2022-07-08 DIAGNOSIS — G4700 Insomnia, unspecified: Secondary | ICD-10-CM

## 2022-07-13 ENCOUNTER — Other Ambulatory Visit: Payer: Self-pay | Admitting: Family Medicine

## 2022-07-13 ENCOUNTER — Ambulatory Visit: Payer: 59 | Admitting: Family Medicine

## 2022-07-13 ENCOUNTER — Encounter: Payer: Self-pay | Admitting: Family Medicine

## 2022-07-13 VITALS — BP 118/78 | HR 95 | Temp 97.6°F | Wt 223.2 lb

## 2022-07-13 DIAGNOSIS — F988 Other specified behavioral and emotional disorders with onset usually occurring in childhood and adolescence: Secondary | ICD-10-CM

## 2022-07-13 DIAGNOSIS — G4700 Insomnia, unspecified: Secondary | ICD-10-CM

## 2022-07-13 MED ORDER — AMPHETAMINE-DEXTROAMPHET ER 20 MG PO CP24: 20.0000 mg | ORAL_CAPSULE | ORAL | 0 refills | Status: DC

## 2022-07-13 MED ORDER — DOXEPIN HCL 10 MG/ML PO CONC
3.0000 mg | Freq: Every evening | ORAL | 0 refills | Status: DC | PRN
Start: 2022-07-13 — End: 2022-10-17

## 2022-07-13 NOTE — Progress Notes (Signed)
Assessment/Plan:   Problem List Items Addressed This Visit       Other   ADD (attention deficit disorder) without hyperactivity    Assessment: Managed with Adderall XR 20 mg capsule daily. Patient reports improved symptoms with medication. Plan: Continue Adderall XR 20 mg daily. Monitor for any cardiovascular side effects. Follow up in 3 months or sooner if concerns arise.      Relevant Medications   amphetamine-dextroamphetamine (ADDERALL XR) 20 MG 24 hr capsule   amphetamine-dextroamphetamine (ADDERALL XR) 20 MG 24 hr capsule   amphetamine-dextroamphetamine (ADDERALL XR) 20 MG 24 hr capsule   Insomnia - Primary    Assessment: Partially managed with doxepin, though excessive sedation is a concern.  Plan: Adjust medication to a liquid form of doxepin to fine-tune dosing, starting with 3 to 6 mg at bedtime. Consider alternative medications if insomnia persists or side effects become intolerable. Monitor closely for any new or worsening symptoms. Follow up in 3 months or sooner if needed.      Relevant Medications   doxepin (SINEQUAN) 10 MG/ML solution    Medications Discontinued During This Encounter  Medication Reason   doxepin (SINEQUAN) 10 MG capsule    amphetamine-dextroamphetamine (ADDERALL XR) 20 MG 24 hr capsule Reorder   amphetamine-dextroamphetamine (ADDERALL XR) 20 MG 24 hr capsule    amphetamine-dextroamphetamine (ADDERALL XR) 20 MG 24 hr capsule    amphetamine-dextroamphetamine (ADDERALL XR) 20 MG 24 hr capsule     Return in about 3 months (around 10/13/2022) for ADD and insomnia.    Subjective:   Encounter date: 07/13/2022  RENEE INTRIAGO is a 22 y.o. female who has Anxiety and depression; IBS (irritable bowel syndrome); Recurrent urticaria; Acid reflux; Hypermobility arthralgia; Hypermobility syndrome; ADD (attention deficit disorder) without hyperactivity; Anxiety; Current moderate episode of major depressive disorder ([place]); Macromastia; and  Insomnia on their problem list..   She  has a past medical history of ADHD, Eczema, and Recurrent urticaria (05/09/2016)..   Chief Complaint: [place] N[place]. [place], a 22 year old female, presents for medical management follow-up regarding Attention Deficit Disorder (ADD) without hyperactivity and Insomnia.  History of Present Illness:  ADHD. [place] complains of ongoing attention difficulties managed with amphetamine-dextroamphetamine (Adderall XR) 20 mg. She notes improved focus and attention when adherent to medication but denies any cardiac symptoms such as chest pain, dyspnea, irregular heartbeat, palpitations, or syncope. Current medication appears well-tolerated and effective in managing her ADHD symptoms.  Insomnia. [place] experiences significant insomnia, which has been partially managed with doxepin 10 mg at bedtime. However, due to excessive sedation leading to oversleeping and difficulty waking up, there's a plan to adjust the treatment. Previous trials of trazodone were unsuccessful due to the side effect of paresthesias.    Adult ADHD Self Report Scale (most recent)     Adult ADHD Self-Report Scale (ASRS-v1.1) Symptom Checklist - 07/13/22 0857       Part A   1. How often do you have trouble wrapping up the final details of a project, once the challenging parts have been done? Often  2. How often do you have difficulty getting things done in order when you have to do a task that requires organization? Often    3. How often do you have problems remembering appointments or obligations? Sometimes  4. When you have a task that requires a lot of thought, how often do you avoid or delay getting started? Very Often    5. How often do you fidget or squirm with your hands or  feet when you have to sit down for a long time? Often  6. How often do you feel overly active and compelled to do things, like you were driven by a motor? Sometimes      Part B   7. How often do you make careless  mistakes when you have to work on a boring or difficult project? Very Often  8. How often do you have difficulty keeping your attention when you are doing boring or repetitive work? Very Often    9. How often do you have difficulty concentrating on what people say to you, even when they are speaking to you directly? Sometimes  10. How often do you misplace or have difficulty finding things at home or at work? Very Often    45. How often are you distracted by activity or noise around you? Sometimes  12. How often do you leave your seat in meetings or other situations in which you are expected to remain seated? Never    13. How often do you feel restless or fidgety? Often  14. How often do you have difficulty unwinding and relaxing when you have time to yourself? Very Often    58. How often do you find yourself talking too much when you are in social situations? Often  16. When you are in a conversation, how often do you find yourself finishing the sentences of the people you are talking to, before they can finish them themselves? Rarely    17. How often do you have difficulty waiting your turn in situations when turn taking is required? Sometimes  18. How often do you interrupt others when they are busy? Sometimes             Review of Systems  Constitutional: Negative.  Negative for chills, diaphoresis, fever, malaise/fatigue and weight loss.  HENT: Negative.  Negative for congestion, ear discharge, ear pain and hearing loss.   Eyes: Negative.  Negative for blurred vision, double vision, photophobia, pain, discharge and redness.  Respiratory: Negative.  Negative for cough, sputum production, shortness of breath and wheezing.   Cardiovascular: Negative.  Negative for chest pain and palpitations.  Gastrointestinal: Negative.  Negative for abdominal pain, blood in stool, constipation, diarrhea, heartburn, melena, nausea and vomiting.  Genitourinary:  Negative for dysuria, flank pain, frequency,  hematuria and urgency.  Musculoskeletal: Negative.  Negative for myalgias.  Skin: Negative.  Negative for itching and rash.  Neurological:  Negative for dizziness, tingling, tremors, speech change, seizures, loss of consciousness, weakness and headaches.  Psychiatric/Behavioral:  Negative for depression, hallucinations, memory loss, substance abuse and suicidal ideas. The patient has insomnia.     No past surgical history on file.  Outpatient Medications Prior to Visit  Medication Sig Dispense Refill   cetirizine (ZYRTEC) 10 MG tablet Take 10 mg by mouth daily as needed for allergies.     fluticasone (FLONASE) 50 MCG/ACT nasal spray Place 2 sprays into both nostrils daily as needed for allergies or rhinitis. 16 mL 5   amphetamine-dextroamphetamine (ADDERALL XR) 20 MG 24 hr capsule Take 1 capsule (20 mg total) by mouth every morning. 30 capsule 0   EPINEPHrine (EPIPEN 2-PAK) 0.3 mg/0.3 mL IJ SOAJ injection Inject 0.3 mg into the muscle once for 1 dose. 2 each 1   norethindrone-ethinyl estradiol (LOESTRIN) 1-20 MG-MCG tablet Take 1 tablet by mouth daily. (Patient not taking: Reported on 06/14/2022) 28 tablet 11   doxepin (SINEQUAN) 10 MG capsule TAKE 1 CAPSULE BY MOUTH AT BEDTIME. (  Patient not taking: Reported on 07/13/2022) 90 capsule 1   No facility-administered medications prior to visit.    Family History  Problem Relation Age of Onset   Hypertension Mother    Hyperlipidemia Mother    Cancer Maternal Grandfather        brain    Diabetes Paternal Grandmother    Congestive Heart Failure Paternal Grandmother    Hypertension Father    Arthritis Father    Eczema Paternal Uncle    Cancer Paternal Grandfather        skin, blood   Migraines Sister    Multiple sclerosis Other        paternal aunt; not entirely sure of diagnosis    Social History   Socioeconomic History   Marital status: Single    Spouse name: Not on file   Number of children: Not on file   Years of education: Not  on file   Highest education level: Some college, no degree  Occupational History   Not on file  Tobacco Use   Smoking status: Never    Passive exposure: Yes   Smokeless tobacco: Never   Tobacco comments:    parents, but do not smoke in home  Vaping Use   Vaping Use: Never used  Substance and Sexual Activity   Alcohol use: No    Alcohol/week: 0.0 standard drinks of alcohol   Drug use: No   Sexual activity: Never    Birth control/protection: None  Other Topics Concern   Not on file  Social History Narrative   Not on file   Social Determinants of Health   Financial Resource Strain: Medium Risk (07/12/2022)   Overall Financial Resource Strain (CARDIA)    Difficulty of Paying Living Expenses: Somewhat hard  Food Insecurity: Food Insecurity Present (07/12/2022)   Hunger Vital Sign    Worried About Running Out of Food in the Last Year: Sometimes true    Ran Out of Food in the Last Year: Never true  Transportation Needs: No Transportation Needs (07/12/2022)   PRAPARE - Hydrologist (Medical): No    Lack of Transportation (Non-Medical): No  Physical Activity: Sufficiently Active (07/12/2022)   Exercise Vital Sign    Days of Exercise per Week: 6 days    Minutes of Exercise per Session: 60 min  Stress: Stress Concern Present (07/12/2022)   [place]    Feeling of Stress : Very much  Social Connections: Socially Isolated (07/12/2022)   Social Connection and Isolation Panel [NHANES]    Frequency of Communication with Friends and Family: Three times a week    Frequency of Social Gatherings with Friends and Family: Once a week    Attends Religious Services: Never    Marine scientist or Organizations: No    Attends Music therapist: Not on file    Marital Status: Never married  Intimate Partner Violence: Not on file  Objective:  Physical Exam: BP 118/78 (BP Location: Left Arm, Patient Position: Sitting, Cuff Size: Large)   Pulse 95   Temp 97.6 F (36.4 C) (Temporal)   Wt 223 lb 3.2 oz (101.2 kg)   LMP 05/31/2022   SpO2 100%   BMI 35.49 kg/m     Physical Exam Constitutional:      General: She is not in acute distress.    Appearance: Normal appearance. She is not ill-appearing or toxic-appearing.  HENT:     Head: Normocephalic and atraumatic.     Nose: Nose normal. No congestion.  Eyes:     General: No scleral icterus.    Extraocular Movements: Extraocular movements intact.  Cardiovascular:     Rate and Rhythm: Normal rate and regular rhythm.     Pulses: Normal pulses.     Heart sounds: Normal heart sounds.  Pulmonary:     Effort: Pulmonary effort is normal. No respiratory distress.     Breath sounds: Normal breath sounds.  Abdominal:     General: Abdomen is flat. Bowel sounds are normal.     Palpations: Abdomen is soft.  Musculoskeletal:        General: Normal range of motion.  Lymphadenopathy:     Cervical: No cervical adenopathy.  Skin:    General: Skin is warm and dry.     Findings: No rash.  Neurological:     General: No focal deficit present.     Mental Status: She is alert and oriented to person, place, and time. Mental status is at baseline.  Psychiatric:        Mood and Affect: Mood normal.        Behavior: Behavior normal.        Thought Content: Thought content normal.        Judgment: Judgment normal.     No results found.  No results found for this or any previous visit (from the past 2160 hour(s)).      Alesia Banda, MD, MS

## 2022-07-13 NOTE — Assessment & Plan Note (Signed)
Assessment: Partially managed with doxepin, though excessive sedation is a concern.  Plan: Adjust medication to a liquid form of doxepin to fine-tune dosing, starting with 3 to 6 mg at bedtime. Consider alternative medications if insomnia persists or side effects become intolerable. Monitor closely for any new or worsening symptoms. Follow up in 3 months or sooner if needed.

## 2022-07-13 NOTE — Assessment & Plan Note (Signed)
Assessment: Managed with Adderall XR 20 mg capsule daily. Patient reports improved symptoms with medication. Plan: Continue Adderall XR 20 mg daily. Monitor for any cardiovascular side effects. Follow up in 3 months or sooner if concerns arise.

## 2022-07-26 ENCOUNTER — Encounter: Payer: Self-pay | Admitting: Family Medicine

## 2022-07-26 ENCOUNTER — Ambulatory Visit: Payer: 59 | Admitting: Family Medicine

## 2022-07-26 VITALS — BP 120/76 | HR 103 | Temp 97.9°F | Ht 66.4 in | Wt 217.6 lb

## 2022-07-26 DIAGNOSIS — H60503 Unspecified acute noninfective otitis externa, bilateral: Secondary | ICD-10-CM

## 2022-07-26 DIAGNOSIS — H60543 Acute eczematoid otitis externa, bilateral: Secondary | ICD-10-CM | POA: Insufficient documentation

## 2022-07-26 MED ORDER — NEOMYCIN-POLYMYXIN-HC 3.5-10000-1 OT SOLN: 3.0000 [drp] | Freq: Four times a day (QID) | OTIC | 0 refills | Status: DC

## 2022-07-26 NOTE — Assessment & Plan Note (Signed)
I recommend she use HC cream on cotton swabs around the external auditory meatus twice a day when her eczema flares. She should use a moisturizing lotion daily on her outer ears.

## 2022-07-26 NOTE — Progress Notes (Signed)
[facility] [address] [place] [address] Dept: [phone] Dept Fax: [phone]  Office Visit  Subjective:    Patient ID: Jill Spencer, female    DOB: Aug 10, 2000, 22 y.o..   MRN: [id number]  Chief Complaint  Patient presents with   Ear Pain    C/o having bilateral ear pain x 4-5 days. No OTC meds.    History of Present Illness:  Patient is in today complaining in of a 4-5 day history of bilateral ear pain. She notes she has a history of eczema. She gets this in her ears and has scaliness, itching, and occasional crusting at the ear canal. This has progressed to being painful over the past several days. She is not specifically treating her eczema, though she does use some Aquaphor lotion at times.  Past Medical History: Patient Active Problem List   Diagnosis Date Noted   Eczema of both external ears 07/26/2022   Insomnia 07/13/2022   Anxiety 06/14/2022   Current moderate episode of major depressive disorder 06/14/2022   Macromastia 06/14/2022   ADD (attention deficit disorder) without hyperactivity 10/30/2020   Hypermobility syndrome 10/19/2019   Hypermobility arthralgia 03/23/2017   Acid reflux 06/20/2016   Recurrent urticaria 05/09/2016   Anxiety and depression 10/13/2014   IBS (irritable bowel syndrome) 10/13/2014   History reviewed. No pertinent surgical history. Family History  Problem Relation Age of Onset   Hypertension Mother    Hyperlipidemia Mother    Cancer Maternal Grandfather        brain    Diabetes Paternal Grandmother    Congestive Heart Failure Paternal Grandmother    Hypertension Father    Arthritis Father    Eczema Paternal Uncle    Cancer Paternal Grandfather        skin, blood   Migraines Sister    Multiple sclerosis Other        paternal aunt; not entirely sure of diagnosis   Outpatient Medications Prior to Visit  Medication Sig Dispense Refill    amphetamine-dextroamphetamine (ADDERALL XR) 20 MG 24 hr capsule Take 1 capsule (20 mg total) by mouth every morning. 30 capsule 0   amphetamine-dextroamphetamine (ADDERALL XR) 20 MG 24 hr capsule Take 1 capsule (20 mg total) by mouth every morning. 30 capsule 0   amphetamine-dextroamphetamine (ADDERALL XR) 20 MG 24 hr capsule Take 1 capsule (20 mg total) by mouth every morning. 30 capsule 0   cetirizine (ZYRTEC) 10 MG tablet Take 10 mg by mouth daily as needed for allergies.     doxepin (SINEQUAN) 10 MG/ML solution Take 0.3-0.6 mLs (3-6 mg total) by mouth at bedtime as needed for sleep. 118 mL 0   EPINEPHrine (EPIPEN 2-PAK) 0.3 mg/0.3 mL IJ SOAJ injection Inject 0.3 mg into the muscle once for 1 dose. 2 each 1   fluticasone (FLONASE) 50 MCG/ACT nasal spray Place 2 sprays into both nostrils daily as needed for allergies or rhinitis. 16 mL 5   norethindrone-ethinyl estradiol (LOESTRIN) 1-20 MG-MCG tablet Take 1 tablet by mouth daily. (Patient not taking: Reported on 06/14/2022) 28 tablet 11   No facility-administered medications prior to visit.   Allergies  Allergen Reactions   Dust Mite Extract    Latex Hives   Nickel Hives and Itching   Shellfish Allergy    Sulfa Antibiotics Other (See Comments)    Flu like sx including fever   Trazodone And Nefazodone Other (See Comments)    Hand numbness  Objective:   Today's Vitals   07/26/22 1058  BP: 120/76  Pulse: (!) 103  Temp: 97.9 F (36.6 C)  TempSrc: Temporal  SpO2: 98%  Weight: 217 lb 9.6 oz (98.7 kg)  Height: 5' 6.4" (1.687 m)   Body mass index is 34.7 kg/m.   General: Well developed, well nourished. No acute distress. HEENT: Normocephalic, non-traumatic. External ears with scaliness and crusting around the external   auditory meatus. EAC are moderately swollen bilaterally.  Psych: Alert and oriented. Normal mood and affect.  Health Maintenance Due  Topic Date Due   HPV VACCINES (1 - 2-dose series) Never done   HIV  Screening  Never done   Hepatitis C Screening  Never done   PAP-Cervical Cytology Screening  Never done   PAP SMEAR-Modifier  Never done     Assessment & Plan:   Problem List Items Addressed This Visit       Nervous and Auditory   Eczema of both external ears    I recommend she use HC cream on cotton swabs around the external auditory meatus twice a day when her eczema flares. She should use a moisturizing lotion daily on her outer ears.      Other Visit Diagnoses     Acute otitis externa of both ears, unspecified type    -  Primary   I will prescribe cortisporin. I reviewed use with [name].   Relevant Medications   neomycin-polymyxin-hydrocortisone (CORTISPORIN) OTIC solution       Return if symptoms worsen or fail to improve.   Haydee Salter, MD

## 2022-08-30 ENCOUNTER — Other Ambulatory Visit: Payer: Self-pay | Admitting: Family Medicine

## 2022-08-30 DIAGNOSIS — F988 Other specified behavioral and emotional disorders with onset usually occurring in childhood and adolescence: Secondary | ICD-10-CM

## 2022-09-02 MED ORDER — AMPHETAMINE-DEXTROAMPHET ER 20 MG PO CP24
20.0000 mg | ORAL_CAPSULE | ORAL | 0 refills | Status: DC
Start: 2022-09-02 — End: 2022-09-02

## 2022-09-02 MED ORDER — AMPHETAMINE-DEXTROAMPHET ER 20 MG PO CP24: 20.0000 mg | ORAL_CAPSULE | ORAL | 0 refills | Status: DC

## 2022-09-02 NOTE — Telephone Encounter (Signed)
Left patient a detailed voice message advising pt we were unable to e-scribe rx and we printed rx and placed up front for pick up and to return call to office with any concerns.

## 2022-10-11 ENCOUNTER — Other Ambulatory Visit: Payer: Self-pay | Admitting: Family Medicine

## 2022-10-11 DIAGNOSIS — F988 Other specified behavioral and emotional disorders with onset usually occurring in childhood and adolescence: Secondary | ICD-10-CM

## 2022-10-13 MED ORDER — AMPHETAMINE-DEXTROAMPHET ER 20 MG PO CP24
20.0000 mg | ORAL_CAPSULE | ORAL | 0 refills | Status: DC
Start: 2022-10-13 — End: 2022-11-03

## 2022-10-17 ENCOUNTER — Other Ambulatory Visit: Payer: Self-pay | Admitting: Family Medicine

## 2022-10-17 DIAGNOSIS — G4700 Insomnia, unspecified: Secondary | ICD-10-CM

## 2022-11-03 ENCOUNTER — Encounter: Payer: Self-pay | Admitting: Family Medicine

## 2022-11-03 ENCOUNTER — Ambulatory Visit: Payer: 59 | Admitting: Family Medicine

## 2022-11-03 VITALS — BP 122/80 | HR 69 | Temp 98.6°F | Ht 67.5 in | Wt 214.4 lb

## 2022-11-03 DIAGNOSIS — M2550 Pain in unspecified joint: Secondary | ICD-10-CM

## 2022-11-03 DIAGNOSIS — E669 Obesity, unspecified: Secondary | ICD-10-CM | POA: Diagnosis not present

## 2022-11-03 DIAGNOSIS — Z0000 Encounter for general adult medical examination without abnormal findings: Secondary | ICD-10-CM | POA: Insufficient documentation

## 2022-11-03 DIAGNOSIS — R700 Elevated erythrocyte sedimentation rate: Secondary | ICD-10-CM

## 2022-11-03 DIAGNOSIS — R7689 Other specified abnormal immunological findings in serum: Secondary | ICD-10-CM

## 2022-11-03 DIAGNOSIS — F988 Other specified behavioral and emotional disorders with onset usually occurring in childhood and adolescence: Secondary | ICD-10-CM

## 2022-11-03 DIAGNOSIS — D649 Anemia, unspecified: Secondary | ICD-10-CM | POA: Diagnosis not present

## 2022-11-03 DIAGNOSIS — E66811 Obesity, class 1: Secondary | ICD-10-CM

## 2022-11-03 DIAGNOSIS — F419 Anxiety disorder, unspecified: Secondary | ICD-10-CM

## 2022-11-03 DIAGNOSIS — M357 Hypermobility syndrome: Secondary | ICD-10-CM

## 2022-11-03 DIAGNOSIS — E559 Vitamin D deficiency, unspecified: Secondary | ICD-10-CM

## 2022-11-03 DIAGNOSIS — Z124 Encounter for screening for malignant neoplasm of cervix: Secondary | ICD-10-CM

## 2022-11-03 DIAGNOSIS — G4700 Insomnia, unspecified: Secondary | ICD-10-CM | POA: Diagnosis not present

## 2022-11-03 DIAGNOSIS — R768 Other specified abnormal immunological findings in serum: Secondary | ICD-10-CM

## 2022-11-03 DIAGNOSIS — R000 Tachycardia, unspecified: Secondary | ICD-10-CM

## 2022-11-03 LAB — CBC WITH DIFFERENTIAL/PLATELET
Basophils Absolute: 0.1 10*3/uL (ref 0.0–0.1)
Basophils Relative: 1.2 % (ref 0.0–3.0)
Eosinophils Absolute: 0.1 10*3/uL (ref 0.0–0.7)
Eosinophils Relative: 1.9 % (ref 0.0–5.0)
HCT: 38.9 % (ref 36.0–46.0)
Hemoglobin: 12.2 g/dL (ref 12.0–15.0)
Lymphocytes Relative: 25.8 % (ref 12.0–46.0)
Lymphs Abs: 1.5 10*3/uL (ref 0.7–4.0)
MCHC: 31.4 g/dL (ref 30.0–36.0)
MCV: 78.6 fl (ref 78.0–100.0)
Monocytes Absolute: 0.3 10*3/uL (ref 0.1–1.0)
Monocytes Relative: 4.7 % (ref 3.0–12.0)
Neutro Abs: 3.8 10*3/uL (ref 1.4–7.7)
Neutrophils Relative %: 66.4 % (ref 43.0–77.0)
Platelets: 248 10*3/uL (ref 150.0–400.0)
RBC: 4.95 Mil/uL (ref 3.87–5.11)
RDW: 17 % — ABNORMAL HIGH (ref 11.5–15.5)
WBC: 5.7 10*3/uL (ref 4.0–10.5)

## 2022-11-03 LAB — COMPREHENSIVE METABOLIC PANEL
ALT: 15 U/L (ref 0–35)
AST: 17 U/L (ref 0–37)
Albumin: 4 g/dL (ref 3.5–5.2)
Alkaline Phosphatase: 67 U/L (ref 39–117)
BUN: 9 mg/dL (ref 6–23)
CO2: 28 mEq/L (ref 19–32)
Calcium: 9.4 mg/dL (ref 8.4–10.5)
Chloride: 103 mEq/L (ref 96–112)
Creatinine, Ser: 0.62 mg/dL (ref 0.40–1.20)
GFR: 125.64 mL/min (ref >60.00)
Glucose, Bld: 108 mg/dL — ABNORMAL HIGH (ref 70–99)
Potassium: 4 mEq/L (ref 3.5–5.1)
Sodium: 137 mEq/L (ref 135–145)
Total Bilirubin: 0.9 mg/dL (ref 0.2–1.2)
Total Protein: 7.1 g/dL (ref 6.0–8.3)

## 2022-11-03 LAB — LIPID PANEL
Cholesterol: 142 mg/dL (ref 0–200)
HDL: 38.1 mg/dL — ABNORMAL LOW (ref >39.00)
LDL Cholesterol: 66 mg/dL (ref 0–99)
NonHDL: 104.22
Total CHOL/HDL Ratio: 4
Triglycerides: 189 mg/dL — ABNORMAL HIGH (ref 0.0–149.0)
VLDL: 37.8 mg/dL (ref 0.0–40.0)

## 2022-11-03 LAB — VITAMIN B12: Vitamin B-12: 605 pg/mL (ref 211–911)

## 2022-11-03 LAB — HEMOGLOBIN A1C: Hgb A1c MFr Bld: 5.6 % (ref 4.6–6.5)

## 2022-11-03 LAB — TSH: TSH: 1.76 u[IU]/mL (ref 0.35–5.50)

## 2022-11-03 LAB — FERRITIN: Ferritin: 10.8 ng/mL (ref 10.0–291.0)

## 2022-11-03 LAB — FOLATE: Folate: 6.1 ng/mL (ref >5.9)

## 2022-11-03 LAB — SEDIMENTATION RATE: Sed Rate: 46 mm/hr — ABNORMAL HIGH (ref 0–20)

## 2022-11-03 LAB — C-REACTIVE PROTEIN: CRP: <1 mg/dL (ref 0.5–20.0)

## 2022-11-03 MED ORDER — AMPHETAMINE-DEXTROAMPHET ER 20 MG PO CP24
20.0000 mg | ORAL_CAPSULE | ORAL | 0 refills | Status: DC
Start: 2022-11-03 — End: 2023-04-13

## 2022-11-03 MED ORDER — AMPHETAMINE-DEXTROAMPHET ER 20 MG PO CP24: 20.0000 mg | ORAL_CAPSULE | ORAL | 0 refills | Status: DC

## 2022-11-03 MED ORDER — HYDROXYZINE HCL 25 MG PO TABS
12.5000 mg | ORAL_TABLET | Freq: Three times a day (TID) | ORAL | 0 refills | Status: DC | PRN
Start: 2022-11-03 — End: 2023-11-13

## 2022-11-03 NOTE — Assessment & Plan Note (Addendum)
ADHD: Currently managed with Adderall XR 20mg , which is effective but exacerbating insomnia, anxiety, and tachycardia. Discussed potential alternatives and adjustments. -Continue Adderall XR 20mg  daily. -Consider adjusting timing of dose to earlier in the morning to potentially improve sleep.

## 2022-11-03 NOTE — Assessment & Plan Note (Addendum)
Anxiety and Insomnia: Worsened by Adderall and work stress. History of ineffective or poorly tolerated sleep aids. -Start Hydroxyzine 12.5-25mg  TID PRN for anxiety and sleep. Counseling on sleep hygiene and possible dose adjustment of Adderall to earlier in the day.

## 2022-11-03 NOTE — Patient Instructions (Signed)
VISIT SUMMARY:  During your visit, we discussed your ADHD, anxiety, insomnia, and episodes of increased heart rate. We noted that your ADHD medication, Adderall XR, is effective for your focus but may be contributing to your sleep issues, anxiety, and heart rate changes. We also discussed your work schedule, which may be affecting your sleep. Your blood pressure is normal, and recent lab work showed no signs of anemia or high triglycerides.  YOUR PLAN:  -ADHD: We will continue your current medication, Adderall XR 20mg , and consider adjusting the timing of the dose to earlier in the morning to potentially improve your sleep. ADHD is a condition that affects your ability to focus and control impulsive behaviors.  -ANXIETY AND INSOMNIA: We will start you on Hydroxyzine, a medication that can help with anxiety and sleep. Anxiety is a feeling of worry or fear, and insomnia is a sleep disorder that can make it hard to fall asleep, stay asleep, or cause you to wake up too early and not be able to get back to sleep.  -INCREASED HEART RATE: We will continue to monitor your heart rate, especially during episodes of anxiety. If your heart rate continues to be high, we may consider starting you on Propranolol, a medication that can help slow your heart rate. Tachycardia is a condition where your heart rate is higher than normal.  INSTRUCTIONS:  We have ordered several lab tests to check your overall health. These include tests for blood count, inflammation, thyroid function, electrolytes, iron, B12, folate, and possibly ANA. We have also referred you to an OBGYN as you requested. Please follow up in 3 months or sooner if your symptoms worsen.

## 2022-11-03 NOTE — Assessment & Plan Note (Signed)
General Health Maintenance:  -Refer to OBGYN as requested by patient. -Follow-up in 3 months or sooner if symptoms worsen.

## 2022-11-03 NOTE — Progress Notes (Signed)
Assessment  Assessment/Plan:   Problem List Items Addressed This Visit       Musculoskeletal and Integument   Hypermobility syndrome     Other   ADD (attention deficit disorder) without hyperactivity    ADHD: Currently managed with Adderall XR 20mg , which is effective but exacerbating insomnia, anxiety, and tachycardia. Discussed potential alternatives and adjustments. -Continue Adderall XR 20mg  daily. -Consider adjusting timing of dose to earlier in the morning to potentially improve sleep.       Relevant Medications   amphetamine-dextroamphetamine (ADDERALL XR) 20 MG 24 hr capsule   amphetamine-dextroamphetamine (ADDERALL XR) 20 MG 24 hr capsule   amphetamine-dextroamphetamine (ADDERALL XR) 20 MG 24 hr capsule   Other Relevant Orders   Vitamin D 1,25 dihydroxy   Anxiety - Primary    Anxiety and Insomnia: Worsened by Adderall and work stress. History of ineffective or poorly tolerated sleep aids. -Start Hydroxyzine 12.5-25mg  TID PRN for anxiety and sleep. Counseling on sleep hygiene and possible dose adjustment of Adderall to earlier in the day.      Relevant Medications   hydrOXYzine (ATARAX) 25 MG tablet   Other Relevant Orders   Vitamin D 1,25 dihydroxy   Insomnia   Relevant Medications   hydrOXYzine (ATARAX) 25 MG tablet   Encounter for well adult exam without abnormal findings    General Health Maintenance:  -Refer to OBGYN as requested by patient. -Follow-up in 3 months or sooner if symptoms worsen.            Tachycardia    Tachycardia: Intermittent episodes of tachycardia up to 120 bpm, associated with anxiety and insomnia. No current tachycardia or hypertension. -Continue monitoring heart rate, particularly during episodes of anxiety. -Consider Propranolol if tachycardia persists or worsens.       Positive ANA (antinuclear antibody)    Pt with history of positive ANA with incomplete work up for inflammatory conditions -Order labs including CBC,  ESR, CRP, thyroid function tests, electrolyte panel, iron studies, B12, folate, and repeat ANA w/ reflex      Relevant Orders   ANA w/Reflex   Sedimentation rate   C-reactive protein   Other Visit Diagnoses     Cervical cancer screening       Relevant Orders   Ambulatory referral to Obstetrics / Gynecology   Anemia, unspecified type       Relevant Orders   CBC with Differential/Platelet   Comprehensive metabolic panel   Vitamin B12   Folate   Iron and TIBC   Ferritin   Urinalysis, Routine w reflex microscopic   Class 1 obesity in adult, unspecified BMI, unspecified obesity type, unspecified whether serious comorbidity present       Relevant Medications   amphetamine-dextroamphetamine (ADDERALL XR) 20 MG 24 hr capsule   amphetamine-dextroamphetamine (ADDERALL XR) 20 MG 24 hr capsule   amphetamine-dextroamphetamine (ADDERALL XR) 20 MG 24 hr capsule   Other Relevant Orders   TSH   Lipid panel   Hemoglobin A1c   Microalbumin / creatinine urine ratio   Vitamin D deficiency           Medications Discontinued During This Encounter  Medication Reason   neomycin-polymyxin-hydrocortisone (CORTISPORIN) OTIC solution    amphetamine-dextroamphetamine (ADDERALL XR) 20 MG 24 hr capsule Reorder   amphetamine-dextroamphetamine (ADDERALL XR) 20 MG 24 hr capsule Reorder    Patient Counseling(The following topics were reviewed and/or handout was given):  -Nutrition: Stressed importance of moderation in sodium/caffeine intake, saturated fat and cholesterol, caloric balance, sufficient intake  of fresh fruits, vegetables, and fiber.  -Stressed the importance of regular exercise.   -Substance Abuse: Discussed cessation/primary prevention of tobacco, alcohol, or other drug use; driving or other dangerous activities under the influence; availability of treatment for abuse.   -Injury prevention: Discussed safety belts, safety helmets, smoke detector, smoking near bedding or upholstery.    -Sexuality: Discussed sexually transmitted diseases, partner selection, use of condoms, avoidance of unintended pregnancy and contraceptive alternatives.   -Dental health: Discussed importance of regular tooth brushing, flossing, and dental visits.  -Health maintenance and immunizations reviewed. Please refer to Health maintenance section.  Return to care in 1 year for next preventative visit.       Subjective:  Chief complaint Encounter date: 11/03/2022  Chief Complaint  Patient presents with   Annual Exam    Fasting. Epi pen refill. Referral to OBGYN. Ok with ai program   Jill Spencer is a 22 y.o. female who presents today for her annual comprehensive physical exam.    Discussed the use of AI scribe software for clinical note transcription with the patient, who gave verbal consent to proceed.  History of Present Illness   The patient, a 22 year old with a history of ADHD, anxiety, depression, and insomnia, presents for a physical and follow-up on her ADHD and insomnia. She reports a recent change in her heart rate, which she describes as "funky." The patient notes that this change in heart rate is worse when she does not sleep well and can be influenced by her anxiety levels. She reports episodes of her heart rate reaching up to 120, during which she experiences chest discomfort. The patient associates these episodes with periods of high anxiety and poor sleep.  The patient is currently on Adderall XR 20 mg for her ADHD, which she reports as being effective for her focus. However, she acknowledges that the medication exacerbates her insomnia, anxiety, and heart rate issues. She also reports that her work schedule, which frequently changes and includes both early morning and late-night shifts, contributes to her sleep disturbances.  The patient has a history of mild anemia and elevated triglycerides, but recent lab work showed normal results. She also has a history of hypermobility  and frequently experiences hives, but denies any joint swelling or rashes. The patient expresses concern about a family history of high blood pressure, but her current blood pressure is within normal limits. She denies any changes in bowel habits or menses.   Has history of positive ANA, although low, and history of vitamin d deficiency.   Lifestyle: Diet: Balanced diet, low caffeine intake since working at [employer]. Exercise: Engages in moderate exercise 3 times a week.  Review of Systems  Constitutional:  Negative for chills, diaphoresis, fever, malaise/fatigue and weight loss.  HENT:  Negative for congestion, ear discharge, ear pain and hearing loss.   Eyes:  Negative for blurred vision, double vision, photophobia, pain, discharge and redness.  Respiratory:  Negative for cough, sputum production, shortness of breath and wheezing.   Cardiovascular:  Positive for chest pain (None currently) and palpitations.  Gastrointestinal:  Negative for abdominal pain, blood in stool, constipation, diarrhea, heartburn, melena, nausea and vomiting.  Genitourinary:  Negative for dysuria, flank pain, frequency, hematuria and urgency.  Musculoskeletal:  Negative for myalgias.  Skin:  Positive for rash (Hives, none currently). Negative for itching.  Neurological:  Negative for dizziness, tingling, tremors, speech change, seizures, loss of consciousness, weakness and headaches.  Psychiatric/Behavioral:  Positive for depression. Negative for hallucinations, memory  loss, substance abuse and suicidal ideas. The patient is nervous/anxious and has insomnia.   All other systems reviewed and are negative.      11/03/2022    9:57 AM 07/13/2022    8:57 AM 06/14/2022    9:26 AM 12/21/2021    9:17 AM 10/14/2019   12:29 PM 05/14/2019    2:42 PM  GAD-7 Generalized Anxiety Disorder Screening Tool  1. Feeling Nervous, Anxious, or on Edge 2 2 2 3 3 3   2. Not Being Able to Stop or Control Worrying 2 2 3 3 3 2   3. Worrying  Too Much About Different Things 2 2 2 3 3 2   4. Trouble Relaxing 2 3 3 2 3 2   5. Being So Restless it's Hard To Sit Still 1 2 1 1 3 2   6. Becoming Easily Annoyed or Irritable 1 1 0 1 3 2   7. Feeling Afraid As If Something Awful Might Happen 2 0 0 1 2 0  Total GAD-7 Score 12 12 11 14 20 13   Difficulty At Work, Home, or Getting  Along With Others? Not difficult at all Somewhat difficult Somewhat difficult Somewhat difficult Somewhat difficult Somewhat difficult      11/03/2022    9:56 AM 07/13/2022    8:56 AM 06/14/2022    9:18 AM 12/21/2021    9:17 AM 06/04/2021    9:09 AM 10/14/2019   12:21 PM 05/14/2019    2:43 PM  Depression screen PHQ 2/9  Decreased Interest 2 2 1 2 2 3 1   Down, Depressed, Hopeless 2 2 1 1 2 3 3   PHQ - 2 Score 4 4 2 3 4 6 4   Altered sleeping 3 3 3 3 3 3 2   Tired, decreased energy 2 3 2 3 2 3 1   Change in appetite 1 2 1 3 1 3 2   Feeling bad or failure about yourself  1 3 1 2 1  1   Trouble concentrating 2 2 3 2 2 3 1   Moving slowly or fidgety/restless 0 0 0 0 0 1 0  Suicidal thoughts 0 0 0 0 0 0 0  PHQ-9 Score 13 17 12 16 13 19 11   Difficult doing work/chores Somewhat difficult Somewhat difficult Somewhat difficult Somewhat difficult  Very difficult Somewhat difficult    Health Maintenance Due  Topic Date Due   HPV VACCINES (1 - 3-dose series) Never done   HIV Screening  Never done   Hepatitis C Screening  Never done   PAP-Cervical Cytology Screening  Never done   PAP SMEAR-Modifier  Never done     PMH:  The following were reviewed and entered/updated in epic: Past Medical History:  Diagnosis Date   ADHD    per patient    Eczema    Recurrent urticaria 05/09/2016    Patient Active Problem List   Diagnosis Date Noted   Encounter for well adult exam without abnormal findings 11/03/2022   Tachycardia 11/03/2022   Positive ANA (antinuclear antibody) 11/03/2022   Eczema of both external ears 07/26/2022   Insomnia 07/13/2022   Anxiety 06/14/2022    Current moderate episode of major depressive disorder (HCC) 06/14/2022   Macromastia 06/14/2022   ADD (attention deficit disorder) without hyperactivity 10/30/2020   Hypermobility syndrome 10/19/2019   Hypermobility arthralgia 03/23/2017   Acid reflux 06/20/2016   Recurrent urticaria 05/09/2016   Anxiety and depression 10/13/2014   IBS (irritable bowel syndrome) 10/13/2014    No past surgical history on file.  Family History  Problem Relation Age of Onset   Hypertension Mother    Hyperlipidemia Mother    Cancer Maternal Grandfather        brain    Diabetes Paternal Grandmother    Congestive Heart Failure Paternal Grandmother    Hypertension Father    Arthritis Father    Eczema Paternal Uncle    Cancer Paternal Grandfather        skin, blood   Migraines Sister    Multiple sclerosis Other        paternal aunt; not entirely sure of diagnosis    Medications- reviewed and updated Outpatient Medications Prior to Visit  Medication Sig Dispense Refill   cetirizine (ZYRTEC) 10 MG tablet Take 10 mg by mouth daily as needed for allergies.     fluticasone (FLONASE) 50 MCG/ACT nasal spray Place 2 sprays into both nostrils daily as needed for allergies or rhinitis. 16 mL 5   amphetamine-dextroamphetamine (ADDERALL XR) 20 MG 24 hr capsule Take 1 capsule (20 mg total) by mouth every morning. 30 capsule 0   amphetamine-dextroamphetamine (ADDERALL XR) 20 MG 24 hr capsule Take 1 capsule (20 mg total) by mouth every morning. 30 capsule 0   doxepin (SINEQUAN) 10 MG/ML solution TAKE 0.3-0.6 MLS (3-6 MG TOTAL) BY MOUTH AT BEDTIME AS NEEDED FOR SLEEP. (Patient not taking: Reported on 11/03/2022) 120 mL 0   EPINEPHrine (EPIPEN 2-PAK) 0.3 mg/0.3 mL IJ SOAJ injection Inject 0.3 mg into the muscle once for 1 dose. 2 each 1   neomycin-polymyxin-hydrocortisone (CORTISPORIN) OTIC solution Place 3 drops into both ears 4 (four) times daily. 10 mL 0   No facility-administered medications prior to visit.     Allergies  Allergen Reactions   Dust Mite Extract    Latex Hives   Nickel Hives and Itching   Shellfish Allergy    Sulfa Antibiotics Other (See Comments)    Flu like sx including fever   Trazodone And Nefazodone Other (See Comments)    Hand numbness    Social History   Socioeconomic History   Marital status: Single    Spouse name: Not on file   Number of children: Not on file   Years of education: Not on file   Highest education level: Some college, no degree  Occupational History   Not on file  Tobacco Use   Smoking status: Never    Passive exposure: Yes   Smokeless tobacco: Never   Tobacco comments:    parents, but do not smoke in home  Vaping Use   Vaping status: Never Used  Substance and Sexual Activity   Alcohol use: No    Alcohol/week: 0.0 standard drinks of alcohol   Drug use: No   Sexual activity: Never    Birth control/protection: None  Other Topics Concern   Not on file  Social History Narrative   Not on file   Social Determinants of Health   Financial Resource Strain: Medium Risk (07/12/2022)   Overall Financial Resource Strain (CARDIA)    Difficulty of Paying Living Expenses: Somewhat hard  Food Insecurity: Food Insecurity Present (07/12/2022)   Hunger Vital Sign    Worried About Running Out of Food in the Last Year: Sometimes true    Ran Out of Food in the Last Year: Never true  Transportation Needs: No Transportation Needs (07/12/2022)   PRAPARE - Administrator, Civil Service (Medical): No    Lack of Transportation (Non-Medical): No  Physical Activity: Sufficiently Active (07/12/2022)  Exercise Vital Sign    Days of Exercise per Week: 6 days    Minutes of Exercise per Session: 60 min  Stress: Stress Concern Present (07/12/2022)   [employer] of Occupational Health - Occupational Stress Questionnaire    Feeling of Stress : Very much  Social Connections: Socially Isolated (07/12/2022)   Social Connection and Isolation Panel  [NHANES]    Frequency of Communication with Friends and Family: Three times a week    Frequency of Social Gatherings with Friends and Family: Once a week    Attends Religious Services: Never    Database administrator or Organizations: No    Attends Engineer, structural: Not on file    Marital Status: Never married        Objective:  Physical Exam: BP 122/80 (BP Location: Left Arm, Patient Position: Sitting, Cuff Size: Large)   Pulse 69   Temp 98.6 F (37 C) (Temporal)   Ht 5' 7.5" (1.715 m)   Wt 214 lb 6.4 oz (97.3 kg)   LMP 10/13/2022   SpO2 100%   BMI 33.08 kg/m   Body mass index is 33.08 kg/m. Wt Readings from Last 3 Encounters:  11/03/22 214 lb 6.4 oz (97.3 kg)  07/26/22 217 lb 9.6 oz (98.7 kg)  07/13/22 223 lb 3.2 oz (101.2 kg)   Results   LABS ANA: 1:40     Lab Results  Component Value Date   WBC 6.4 02/19/2021   HGB 11.8 (L) 02/19/2021   HCT 37.0 02/19/2021   MCV 78.7 02/19/2021   PLT 231.0 02/19/2021   Lab Results  Component Value Date   TSH 1.69 02/19/2021   Last metabolic panel Lab Results  Component Value Date   GLUCOSE 102 (H) 02/19/2021   NA 139 02/19/2021   K 3.7 02/19/2021   CL 105 02/19/2021   CO2 28 02/19/2021   BUN 10 02/19/2021   CREATININE 0.62 02/19/2021   GFR 127.15 02/19/2021   CALCIUM 8.9 02/19/2021   PROT 7.2 02/19/2021   ALBUMIN 4.0 02/19/2021   BILITOT 0.7 02/19/2021   ALKPHOS 53 02/19/2021   AST 14 02/19/2021   ALT 8 02/19/2021   ANIONGAP 6 01/27/2021   Last vitamin D Lab Results  Component Value Date   VD25OH 9 (L) 06/28/2019    Physical Exam Constitutional:      General: She is not in acute distress.    Appearance: Normal appearance. She is not ill-appearing or toxic-appearing.  HENT:     Head: Normocephalic and atraumatic.     Right Ear: Hearing, tympanic membrane, ear canal and external ear normal. There is no impacted cerumen.     Left Ear: Hearing, tympanic membrane, ear canal and external ear  normal. There is no impacted cerumen.     Nose: Nose normal. No congestion.     Mouth/Throat:     Lips: No lesions.     Mouth: Mucous membranes are moist.     Pharynx: Oropharynx is clear. No oropharyngeal exudate.  Eyes:     General: No scleral icterus.       Right eye: No discharge.        Left eye: No discharge.     Conjunctiva/sclera: Conjunctivae normal.     Pupils: Pupils are equal, round, and reactive to light.  Neck:     Thyroid: No thyroid mass, thyromegaly or thyroid tenderness.  Cardiovascular:     Rate and Rhythm: Normal rate and regular rhythm.  Pulses: Normal pulses.     Heart sounds: Normal heart sounds.  Pulmonary:     Effort: Pulmonary effort is normal. No respiratory distress.     Breath sounds: Normal breath sounds.  Abdominal:     General: Abdomen is flat. Bowel sounds are normal.     Palpations: Abdomen is soft.  Musculoskeletal:        General: Normal range of motion.     Cervical back: Normal range of motion.     Right lower leg: No edema.     Left lower leg: No edema.  Lymphadenopathy:     Cervical: No cervical adenopathy.  Skin:    General: Skin is warm and dry.     Findings: No rash.  Neurological:     General: No focal deficit present.     Mental Status: She is alert and oriented to person, place, and time. Mental status is at baseline.     Deep Tendon Reflexes:     Reflex Scores:      Patellar reflexes are 2+ on the right side and 2+ on the left side. Psychiatric:        Mood and Affect: Mood is anxious.        Speech: Speech is not tangential.        Behavior: Behavior is cooperative.        Thought Content: Thought content does not include suicidal ideation.        Cognition and Memory: Cognition is not impaired.        Judgment: Judgment is not inappropriate.         At today's visit, we discussed treatment options, associated risk and benefits, and engage in counseling as needed.  Additionally the following were reviewed: Past  medical records, past medical and surgical history, family and social background, as well as relevant laboratory results, imaging findings, and specialty notes, where applicable.  This message was generated using dictation software, and as a result, it may contain unintentional typos or errors.  Nevertheless, extensive effort was made to accurately convey at the pertinent aspects of the patient visit.    There may have been are other unrelated non-urgent complaints, but due to the busy schedule and the amount of time already spent with her, time does not permit to address these issues at today's visit. Another appointment may have or has been requested to review these additional issues.   Thomes Dinning, MD, MS

## 2022-11-03 NOTE — Assessment & Plan Note (Signed)
Pt with history of positive ANA with incomplete work up for inflammatory conditions -Order labs including CBC, ESR, CRP, thyroid function tests, electrolyte panel, iron studies, B12, folate, and repeat ANA w/ reflex

## 2022-11-03 NOTE — Progress Notes (Signed)
Pt unable to leave urine. Will return to drop off urine sample. Orders placed in future

## 2022-11-03 NOTE — Assessment & Plan Note (Signed)
Tachycardia: Intermittent episodes of tachycardia up to 120 bpm, associated with anxiety and insomnia. No current tachycardia or hypertension. -Continue monitoring heart rate, particularly during episodes of anxiety. -Consider Propranolol if tachycardia persists or worsens.

## 2022-11-04 ENCOUNTER — Other Ambulatory Visit: Payer: Self-pay | Admitting: Family Medicine

## 2022-11-04 DIAGNOSIS — G4700 Insomnia, unspecified: Secondary | ICD-10-CM

## 2022-11-04 LAB — IRON AND TIBC
Iron Saturation: 10 % — ABNORMAL LOW (ref 15–55)
Iron: 36 ug/dL (ref 27–159)
Total Iron Binding Capacity: 370 ug/dL (ref 250–450)
UIBC: 334 ug/dL (ref 131–425)

## 2022-11-04 LAB — ANA W/REFLEX: Anti Nuclear Antibody (ANA): NEGATIVE

## 2022-11-07 LAB — VITAMIN D 1,25 DIHYDROXY
Vitamin D 1, 25 (OH)2 Total: 40 pg/mL (ref 18–72)
Vitamin D2 1, 25 (OH)2: <8 pg/mL
Vitamin D3 1, 25 (OH)2: 40 pg/mL

## 2022-11-07 NOTE — Telephone Encounter (Signed)
Left patient a detailed voice message to return call to office regarding rx refill request for doxepin. Pt reported Patient not taking: Reported on 11/03/2022.

## 2022-11-08 NOTE — Addendum Note (Signed)
Addended by: Fanny Bien B on: 11/08/2022 10:12 AM   Modules accepted: Orders

## 2022-11-09 NOTE — Telephone Encounter (Signed)
Please advise, see below. [place] for refill?

## 2022-11-21 ENCOUNTER — Other Ambulatory Visit: Payer: Self-pay | Admitting: Family Medicine

## 2022-11-21 DIAGNOSIS — F988 Other specified behavioral and emotional disorders with onset usually occurring in childhood and adolescence: Secondary | ICD-10-CM

## 2022-11-29 NOTE — Telephone Encounter (Signed)
    Reached out to pharmacy for clarity. Revonda Standard stated that adderall was never picked up and placed back in stock after 14 days. She placed a refill while I was on the phone and stated patient should be able to pick it up this afternoon.   Tried calling patient, but voicemail is full. Will send a mychart message.

## 2022-12-30 ENCOUNTER — Other Ambulatory Visit: Payer: Self-pay | Admitting: Family Medicine

## 2022-12-30 DIAGNOSIS — F988 Other specified behavioral and emotional disorders with onset usually occurring in childhood and adolescence: Secondary | ICD-10-CM

## 2022-12-30 MED ORDER — AMPHETAMINE-DEXTROAMPHET ER 20 MG PO CP24
20.0000 mg | ORAL_CAPSULE | ORAL | 0 refills | Status: DC
Start: 2022-12-30 — End: 2023-04-13

## 2023-01-20 ENCOUNTER — Ambulatory Visit: Payer: 59 | Admitting: Physician Assistant

## 2023-02-23 ENCOUNTER — Other Ambulatory Visit: Payer: Self-pay | Admitting: Nurse Practitioner

## 2023-02-23 DIAGNOSIS — F988 Other specified behavioral and emotional disorders with onset usually occurring in childhood and adolescence: Secondary | ICD-10-CM

## 2023-02-24 ENCOUNTER — Telehealth: Payer: 59

## 2023-02-25 ENCOUNTER — Telehealth: Payer: 59

## 2023-02-27 ENCOUNTER — Telehealth: Payer: Self-pay | Admitting: Family Medicine

## 2023-02-27 ENCOUNTER — Other Ambulatory Visit: Payer: Self-pay | Admitting: Family Medicine

## 2023-02-27 DIAGNOSIS — F988 Other specified behavioral and emotional disorders with onset usually occurring in childhood and adolescence: Secondary | ICD-10-CM

## 2023-02-27 NOTE — Telephone Encounter (Signed)
Pt would like to transfer to Dr Jon Billings at [facility] from Dr Janee Morn. Please advise

## 2023-03-14 ENCOUNTER — Other Ambulatory Visit: Payer: Self-pay | Admitting: Medical Genetics

## 2023-03-14 ENCOUNTER — Encounter: Payer: 59 | Admitting: Internal Medicine

## 2023-03-14 DIAGNOSIS — Z006 Encounter for examination for normal comparison and control in clinical research program: Secondary | ICD-10-CM

## 2023-04-11 ENCOUNTER — Other Ambulatory Visit (HOSPITAL_COMMUNITY): Payer: Self-pay | Attending: Medical Genetics

## 2023-04-13 ENCOUNTER — Encounter: Payer: Self-pay | Admitting: Internal Medicine

## 2023-04-13 ENCOUNTER — Ambulatory Visit: Payer: 59 | Admitting: Internal Medicine

## 2023-04-13 VITALS — BP 120/88 | HR 97 | Temp 97.9°F | Ht 67.5 in | Wt 218.8 lb

## 2023-04-13 DIAGNOSIS — G4700 Insomnia, unspecified: Secondary | ICD-10-CM

## 2023-04-13 DIAGNOSIS — R768 Other specified abnormal immunological findings in serum: Secondary | ICD-10-CM

## 2023-04-13 DIAGNOSIS — Z79899 Other long term (current) drug therapy: Secondary | ICD-10-CM | POA: Diagnosis not present

## 2023-04-13 DIAGNOSIS — M357 Hypermobility syndrome: Secondary | ICD-10-CM | POA: Diagnosis not present

## 2023-04-13 DIAGNOSIS — R000 Tachycardia, unspecified: Secondary | ICD-10-CM

## 2023-04-13 DIAGNOSIS — H60543 Acute eczematoid otitis externa, bilateral: Secondary | ICD-10-CM

## 2023-04-13 DIAGNOSIS — K219 Gastro-esophageal reflux disease without esophagitis: Secondary | ICD-10-CM

## 2023-04-13 DIAGNOSIS — F331 Major depressive disorder, recurrent, moderate: Secondary | ICD-10-CM

## 2023-04-13 DIAGNOSIS — F419 Anxiety disorder, unspecified: Secondary | ICD-10-CM

## 2023-04-13 DIAGNOSIS — K582 Mixed irritable bowel syndrome: Secondary | ICD-10-CM

## 2023-04-13 DIAGNOSIS — M2550 Pain in unspecified joint: Secondary | ICD-10-CM | POA: Diagnosis not present

## 2023-04-13 DIAGNOSIS — L500 Allergic urticaria: Secondary | ICD-10-CM

## 2023-04-13 DIAGNOSIS — G8929 Other chronic pain: Secondary | ICD-10-CM

## 2023-04-13 DIAGNOSIS — F988 Other specified behavioral and emotional disorders with onset usually occurring in childhood and adolescence: Secondary | ICD-10-CM | POA: Diagnosis not present

## 2023-04-13 DIAGNOSIS — N62 Hypertrophy of breast: Secondary | ICD-10-CM

## 2023-04-13 MED ORDER — METHYLPHENIDATE HCL ER (OSM) 18 MG PO TBCR: 18.0000 mg | EXTENDED_RELEASE_TABLET | Freq: Every day | ORAL | 0 refills | Status: DC

## 2023-04-13 NOTE — Progress Notes (Signed)
[facility]: [phone]   -- Medical Office Visit --  Patient:  Jill Spencer      Age: 22 y.o.       Sex:  female  Date:   04/13/2023 Today's Healthcare Provider: Lula Olszewski, MD  =======================================================================   Chief complaint: Transfer of care, Elevated heart rate, and Chronic pain managment  Assessment & Plan ADD (attention deficit disorder) without hyperactivity ADHD Due to poorly controlled ADHD symptoms following a three-month discontinuation of Adderall, which also alleviated chronic pain but elevated heart rate, we will prescribe Concerta 18 mg daily. Concerta was chosen for its potentially better balance for ADHD symptoms and heart rate control, with informed consent covering its longer duration of action (10-14 hours) compared to Adderall (6-7 hours), potential for lower heart rate impact, and cost considerations. A one-month follow-up is scheduled to monitor heart rate; if consistently above 120-130 bpm, we will consider supplemental medication. High risk medication use Assessment: Risks and benefits were weighed and continued maintenance of the controlled substance prescription will be provided.   Continued education about risks and benefits and safe use was also provided.  Importance of securing medications has been reviewed.  Relevant comorbid conditions include anxiety and insomnia.  These factors are taken into account in the overall treatment plan to minimize potential interactions or complications.   We chose a low dose of a long acting amphetamine to balance these concerns.  PDMP reviewed during this encounter.  Urine Drug Screening Data obtained.  Controlled substance contract signed: 04/13/23  Discussed most important thing is pill count uploads for me which demonstrate not using more than prescribed.  Hypermobility arthralgia Hypermobility Syndrome Suspected hypermobility  syndrome, considering Ehlers-Danlos Syndrome, they are interested in genetic testing despite the potential high cost. Informed consent included discussion of the $3800 cost of genetic testing and potential insurance denial. We will order genetic testing for Ehlers-Danlos Syndrome and discuss financial implications and insurance coverage for genetic testing. Hypermobility syndrome Hypermobility Syndrome Suspected hypermobility syndrome, considering Ehlers-Danlos Syndrome, they are interested in genetic testing despite the potential high cost. Informed consent included discussion of the $3800 cost of genetic testing and potential insurance denial. We will order genetic testing for Ehlers-Danlos Syndrome and discuss financial implications and insurance coverage for genetic testing. Tachycardia Elevated Heart Rate After discontinuing Adderall, their heart rate improved. With Concerta's potentially lower impact on heart rate, we will monitor their heart rate during Concerta use and consider supplemental medication if it remains elevated. Other chronic pain Chronic Pain They reported chronic joint pain alleviation with Adderall. We are exploring alternative ADHD medications that may also help with pain management, with Concerta being discussed for its potential benefits for pain control due to its longer duration of action and steady release. We will monitor pain levels with Concerta use and evaluate its effectiveness on pain at the follow-up. Insomnia, unspecified type Insomnia They have previously tried doxepin for insomnia. We discussed the potential impact of ADHD medications on sleep, including Concerta's long-acting nature potentially affecting sleep, but noted that ADHD medications often paradoxically improve sleep in ADHD patients. We will monitor sleep patterns with Concerta use and discuss further treatment options if insomnia persists. Recurrent urticaria  Anxiety and depression  Irritable  bowel syndrome with both constipation and diarrhea  Anxiety  Moderate episode of recurrent major depressive disorder (HCC) Major Depressive Disorder They scored 11 on the depression scale, indicating mild depression. Previously on Lexapro, which was discontinued due to  lack of efficacy, they prefer not to try new antidepressants at this time. We discussed monitoring mood and reassessing the need for psychiatric referral at the follow-up. Eczema of both external ears  Macromastia  Positive ANA (antinuclear antibody)  Gastroesophageal reflux disease without esophagitis      Orders Placed During this Encounter:   Orders Placed This Encounter  Procedures   Familial Aortopathy Panel   DRUG MONITOR, PANEL 5, SCREEN, URINE   Meds ordered this encounter  Medications   methylphenidate (CONCERTA) 18 MG PO CR tablet    Sig: Take 1 tablet (18 mg total) by mouth daily.    Dispense:  30 tablet    Refill:  0     General Health Maintenance We reviewed general health maintenance in the context of their current medications and conditions, advising against the use of alcohol and benzodiazepines for sleep due to addiction risk. The importance of avoiding alcohol for sleep and the risk of benzodiazepine use were discussed.  Follow-up A one-month follow-up appointment is scheduled to perform a drug screen and pill count, and to monitor for any side effects or issues with Concerta.   SUBJECTIVE: 22 y.o. female who has Anxiety and depression; IBS (irritable bowel syndrome); Recurrent urticaria; Acid reflux; Hypermobility arthralgia; Hypermobility syndrome; ADD (attention deficit disorder) without hyperactivity; Anxiety; Current moderate episode of major depressive disorder (HCC); Macromastia; Insomnia; Eczema of both external ears; Tachycardia; and Positive ANA (antinuclear antibody) on their problem list.  History of Present Illness The patient, previously diagnosed with ADHD and chronic joint  pain, presented with concerns about balancing these conditions with their heart rate. They reported a history of taking Adderall for ADHD, which they found also alleviated their chronic pain. However, they had not been taking Adderall for the past three to four months due to prescription refill issues. During this period, they noticed an increase in joint pain and worsening ADHD symptoms, but an improvement in their heart rate.  The patient also reported a history of working in a high-stress environment, which they believed might have contributed to their heart rate issues. They recently changed jobs and are currently in a transition period with their health insurance coverage.  In addition to ADHD and chronic joint pain, the patient has a history of recurrent urticaria, positive ANA, insomnia, IBS, and hypermobility syndrome. They have previously tried Lexapro for major depressive disorder but did not find it beneficial. They reported that being on ADHD medication helps manage their depressive symptoms.  The patient expressed interest in exploring a potential diagnosis of Ehlers-Danlos syndrome due to their hypermobility, despite the potential high cost of genetic testing. They have a history of managing their health insurance independently after being removed from their father's insurance plan at the age of [age].  In summary, the patient is seeking a balance between managing their ADHD symptoms, chronic joint pain, and heart rate. They are open to trying different medications and are willing to undergo genetic testing for a potential diagnosis of Ehlers-Danlos syndrome. Willing to pay for 4000$ test for hypermobility to confirm   Note that patient  has a past medical history of ADHD, Eczema, and Recurrent urticaria (05/09/2016).  Problem list overviews that were updated at today's visit: Problem  Encounter for Well Adult Exam Without Abnormal Findings (Resolved)    Med reconciliation: Current  Outpatient Medications on File Prior to Visit  Medication Sig   cetirizine (ZYRTEC) 10 MG tablet Take 10 mg by mouth daily as needed for allergies.  doxepin (SINEQUAN) 10 MG/ML solution TAKE 0.3-0.6 MLS (3-6 MG TOTAL) BY MOUTH AT BEDTIME AS NEEDED FOR SLEEP.   fluticasone (FLONASE) 50 MCG/ACT nasal spray Place 2 sprays into both nostrils daily as needed for allergies or rhinitis.   hydrOXYzine (ATARAX) 25 MG tablet Take 0.5-1 tablets (12.5-25 mg total) by mouth every 8 (eight) hours as needed for anxiety or itching (insomnia).   No current facility-administered medications on file prior to visit.   Medications Discontinued During This Encounter  Medication Reason   EPINEPHrine (EPIPEN 2-PAK) 0.3 mg/0.3 mL IJ SOAJ injection Expired Prescription   amphetamine-dextroamphetamine (ADDERALL XR) 20 MG 24 hr capsule Completed Course   amphetamine-dextroamphetamine (ADDERALL XR) 20 MG 24 hr capsule Completed Course   amphetamine-dextroamphetamine (ADDERALL XR) 20 MG 24 hr capsule Completed Course      Objective   Physical Exam     04/13/2023    9:37 AM 11/03/2022    9:02 AM 07/26/2022   10:58 AM  Vitals with BMI  Height 5' 7.5" 5' 7.5" 5' 6.4"  Weight 218 lbs 13 oz 214 lbs 6 oz 217 lbs 10 oz  BMI 33.74 33.06 34.68  Systolic 120 122 130  Diastolic 88 80 76  Pulse 97 69 103   Wt Readings from Last 10 Encounters:  04/13/23 218 lb 12.8 oz (99.2 kg)  11/03/22 214 lb 6.4 oz (97.3 kg)  07/26/22 217 lb 9.6 oz (98.7 kg)  07/13/22 223 lb 3.2 oz (101.2 kg)  06/14/22 222 lb 12.8 oz (101.1 kg)  12/21/21 221 lb 12.8 oz (100.6 kg)  06/04/21 214 lb 12.8 oz (97.4 kg)  02/19/21 205 lb 1.6 oz (93 kg)  01/27/21 206 lb (93.4 kg)  12/06/19 218 lb 4.8 oz (99 kg)   Vital signs reviewed.  Nursing notes reviewed. Weight trend reviewed. Abnormalities and Problem-Specific physical exam findings:    General Appearance:  No acute distress appreciable.   Well-groomed, healthy-appearing female.  Well  proportioned with no abnormal fat distribution.  Good muscle tone. Pulmonary:  Normal work of breathing at rest, no respiratory distress apparent. SpO2: 100 %  Musculoskeletal: All extremities are intact.  Neurological:  Awake, alert, oriented, and engaged.  No obvious focal neurological deficits or cognitive impairments.  Sensorium seems unclouded.   Speech is clear and coherent with logical content. Psychiatric:  Appropriate mood, pleasant and cooperative demeanor, thoughtful and engaged during the exam    No results found for any visits on 04/13/23. Office Visit on 11/03/2022  Component Date Value   Anti Nuclear Antibody (A* 11/03/2022 Negative    Sed Rate 11/03/2022 46 (H)    CRP 11/03/2022 <1.0    TSH 11/03/2022 1.76    Cholesterol 11/03/2022 142    Triglycerides 11/03/2022 189.0 (H)    HDL 11/03/2022 38.10 (L)    VLDL 11/03/2022 37.8    LDL Cholesterol 11/03/2022 66    Total CHOL/HDL Ratio 11/03/2022 4    NonHDL 11/03/2022 104.22    Hgb A1c MFr Bld 11/03/2022 5.6    Vitamin D 1, 25 (OH)2 To* 11/03/2022 40    Vitamin D3 1, 25 (OH)2 11/03/2022 40    Vitamin D2 1, 25 (OH)2 11/03/2022 <8    WBC 11/03/2022 5.7    RBC 11/03/2022 4.95    Hemoglobin 11/03/2022 12.2    HCT 11/03/2022 38.9    MCV 11/03/2022 78.6    MCHC 11/03/2022 31.4    RDW 11/03/2022 17.0 (H)    Platelets 11/03/2022 248.0    Neutrophils Relative %  11/03/2022 66.4    Lymphocytes Relative 11/03/2022 25.8    Monocytes Relative 11/03/2022 4.7    Eosinophils Relative 11/03/2022 1.9    Basophils Relative 11/03/2022 1.2    Neutro Abs 11/03/2022 3.8    Lymphs Abs 11/03/2022 1.5    Monocytes Absolute 11/03/2022 0.3    Eosinophils Absolute 11/03/2022 0.1    Basophils Absolute 11/03/2022 0.1    Sodium 11/03/2022 137    Potassium 11/03/2022 4.0    Chloride 11/03/2022 103    CO2 11/03/2022 28    Glucose, Bld 11/03/2022 108 (H)    BUN 11/03/2022 9    Creatinine, Ser 11/03/2022 0.62    Total Bilirubin 11/03/2022  0.9    Alkaline Phosphatase 11/03/2022 67    AST 11/03/2022 17    ALT 11/03/2022 15    Total Protein 11/03/2022 7.1    Albumin 11/03/2022 4.0    GFR 11/03/2022 125.64    Calcium 11/03/2022 9.4    Vitamin B-12 11/03/2022 605    Folate 11/03/2022 6.1    Total Iron Binding Capac* 11/03/2022 370    UIBC 11/03/2022 334    Iron 11/03/2022 36    Iron Saturation 11/03/2022 10 (L)    Ferritin 11/03/2022 10.8   No image results found. No results found.No results found.      This document was synthesized by artificial intelligence ([employer]) using HIPAA-compliant recording of the clinical interaction;   We discussed the use of AI scribe software for clinical note transcription with the patient, who gave verbal consent to proceed.    Additional Info: This encounter employed state-of-the-art, real-time, collaborative documentation. The patient actively reviewed and assisted in updating their electronic medical record on a shared screen, ensuring transparency and facilitating joint problem-solving for the problem list, overview, and plan. This approach promotes accurate, informed care. The treatment plan was discussed and reviewed in detail, including medication safety, potential side effects, and all patient questions. We confirmed understanding and comfort with the plan. Follow-up instructions were established, including contacting the office for any concerns, returning if symptoms worsen, persist, or new symptoms develop, and precautions for potential emergency department visits.

## 2023-04-13 NOTE — Assessment & Plan Note (Signed)
Hypermobility Syndrome Suspected hypermobility syndrome, considering Ehlers-Danlos Syndrome, they are interested in genetic testing despite the potential high cost. Informed consent included discussion of the $3800 cost of genetic testing and potential insurance denial. We will order genetic testing for Ehlers-Danlos Syndrome and discuss financial implications and insurance coverage for genetic testing.

## 2023-04-13 NOTE — Patient Instructions (Signed)
VISIT SUMMARY:  During your visit, we discussed your ongoing challenges with ADHD, chronic joint pain, and heart rate management. We reviewed your history of medication use, including the recent discontinuation of Adderall, and explored alternative treatment options. We also addressed your concerns about insomnia, depression, and hypermobility syndrome, and discussed potential genetic testing for Ehlers-Danlos syndrome. General health maintenance and lifestyle recommendations were also reviewed.  YOUR PLAN:  -ADHD: ADHD is a condition characterized by symptoms of inattention, hyperactivity, and impulsivity. We have prescribed Concerta 18 mg daily to help manage your ADHD symptoms and chronic pain while potentially having a lower impact on your heart rate. We will follow up in one month to monitor your heart rate and overall response to the medication.  -ELEVATED HEART RATE: Your heart rate improved after discontinuing Adderall. We will monitor your heart rate while you are on Concerta and consider additional medication if it remains elevated.  -CHRONIC PAIN: Chronic pain refers to persistent pain that can last for months or longer. We are exploring Concerta as an alternative ADHD medication that may also help manage your chronic pain. We will assess its effectiveness at your follow-up appointment.  -INSOMNIA: Insomnia is difficulty falling or staying asleep. We discussed the potential impact of Concerta on your sleep and will monitor your sleep patterns. If insomnia persists, we will consider further treatment options.  -MAJOR DEPRESSIVE DISORDER: Major depressive disorder is a mood disorder causing persistent feelings of sadness and loss of interest. You scored 11 on the depression scale, indicating mild depression. We will monitor your mood and reassess the need for psychiatric referral at your follow-up appointment.  -HYPERMOBILITY SYNDROME: Hypermobility syndrome involves joints that move beyond  the normal range of motion and can be associated with conditions like Ehlers-Danlos syndrome. We discussed the option of genetic testing for Ehlers-Danlos syndrome, including the potential high cost and insurance coverage issues. We will proceed with the testing and discuss the financial implications.  -GENERAL HEALTH MAINTENANCE: We reviewed your general health maintenance, advising against the use of alcohol and benzodiazepines for sleep due to the risk of addiction. Maintaining a healthy lifestyle is important for managing your conditions.  INSTRUCTIONS:  Please schedule a follow-up appointment in one month for a drug screen, pill count, and to monitor your response to Concerta. We will also reassess your heart rate, pain levels, sleep patterns, and mood at that time.  Welcome aboard!   Today's visit was a valuable first step in understanding your health and starting your personalized care journey. We discussed your medical history and medications in detail. Given the extensive information, we prioritized addressing your most pressing concerns.  We understood those concerns to be:  Transfer of care, Elevated heart rate, and Chronic pain managment   Building a Complete Picture  To create the most effective care plan possible, we may need additional information from previous providers. We encouraged you to gather any relevant medical records for your next visit. This will help [place] build a more complete picture and develop a personalized plan together. In the meantime, we'll address your immediate concerns and provide resources to help you manage all of your medical issues.  We encourage you to use MyChart to review these efforts, and to help [place] find and correct any omissions or errors in your medical chart.  Managing Your Health Over Time  Managing every aspect of your health in a single visit isn't always feasible, but that's okay.  We addressed your most pressing concerns today and charted  a  course for future care. Acute conditions or preventive care measures may require further attention.  We encourage you to schedule a follow-up visit at your earliest convenience to discuss any unresolved issues.  We strongly encourage participation in annual preventive care visits to help [place] develop a more thorough understanding of your health and to help you maintain optimal wellness - please inquire about scheduling your next one with [place] at your earliest convenience.  Your Satisfaction Matters  It was a pleasure seeing you today!  Your health and satisfaction will always be my top priorities. If you believe your experience today was worthy of a 5-star rating, I'd be grateful for your feedback!  Lula Olszewski, MD  Next Steps  Schedule Follow-Up:  We recommend a follow-up appointment in 1 year for your next wellness visit.  If you develop any new problems, want to address any medical issues, or your condition worsens before then, please call us for an appointment or seek emergency care. Preventive Care:  Don't forget to schedule your annual preventive care visit!  Please review your attached preventive care information. Make sure to arrange appointments for dental and vision routine screening, and use nightly nasal saline mist to keep your sinuses clear. Medical Information Release:  For any relevant medical information we don't have, please sign a release form at the front desk so we can obtain it for your records. Lab & X-ray Appointments:  Scheduled any incomplete lab tests today or call us to schedule.  X-Rays can be done without an appointment at [facility] at [facility] ([address], [address]), M-F 8:30am-noon or 1pm-5pm.  Just tell them you're there for X-rays ordered by Dr. Jon Billings.  We'll receive the results and contact you by phone or MyChart to discuss next steps.  Making the Most of Our Focused (20 minute) Appointments:  [x]   Clearly state your top concerns at the beginning of  the visit to focus our discussion [x]   If you anticipate you will need more time, please inform the front desk during scheduling - we can book multiple appointments in the same week. [x]   If you have transportation problems- use our convenient video appointments or ask about transportation support. [x]   We can get down to business faster if you use MyChart to update information before the visit and submit non-urgent questions before your visit. Thank you for taking the time to provide details through MyChart.  Let our nurse know and she can import this information into your encounter documents.  Arrival and Wait Times: [x]   Arriving on time ensures that everyone receives prompt attention. [x]   Early morning (8a) and afternoon (1p) appointments tend to have shortest wait times. [x]   Unfortunately, we cannot delay appointments for late arrivals or hold slots during phone calls.  Thank you for collaborating with [place] to prioritize your health. We look forward to serving you!  Bring to Your Next Appointment  Medications: Please bring all your medication bottles to your next appointment to ensure we have an accurate record of your prescriptions. Health Diaries: If you're monitoring any health conditions at home, keeping a diary of your readings can be very helpful for discussions at your next appointment.  Reviewing Your Records  Please Review this early draft of your clinical notes below and the final encounter summary tomorrow on MyChart after its been completed.   ADD (attention deficit disorder) without hyperactivity Assessment & Plan: ADHD Due to poorly controlled ADHD symptoms following a three-month  discontinuation of Adderall, which also alleviated chronic pain but elevated heart rate, we will prescribe Concerta 18 mg daily. Concerta was chosen for its potentially better balance for ADHD symptoms and heart rate control, with informed consent covering its longer duration of action (10-14  hours) compared to Adderall (6-7 hours), potential for lower heart rate impact, and cost considerations. A one-month follow-up is scheduled to monitor heart rate; if consistently above 120-130 bpm, we will consider supplemental medication.  Orders: -     Methylphenidate HCl ER (OSM); Take 1 tablet (18 mg total) by mouth daily.  Dispense: 30 tablet; Refill: 0 -     DRUG MONITOR, PANEL 5, SCREEN, URINE  High risk medication use -     DRUG MONITOR, PANEL 5, SCREEN, URINE  Hypermobility arthralgia Assessment & Plan: Hypermobility Syndrome Suspected hypermobility syndrome, considering Ehlers-Danlos Syndrome, they are interested in genetic testing despite the potential high cost. Informed consent included discussion of the $3800 cost of genetic testing and potential insurance denial. We will order genetic testing for Ehlers-Danlos Syndrome and discuss financial implications and insurance coverage for genetic testing.  Orders: -     Familial Aortopathy Panel -     DRUG MONITOR, PANEL 5, SCREEN, URINE  Hypermobility syndrome Assessment & Plan: Hypermobility Syndrome Suspected hypermobility syndrome, considering Ehlers-Danlos Syndrome, they are interested in genetic testing despite the potential high cost. Informed consent included discussion of the $3800 cost of genetic testing and potential insurance denial. We will order genetic testing for Ehlers-Danlos Syndrome and discuss financial implications and insurance coverage for genetic testing.  Orders: -     Familial Aortopathy Panel -     DRUG MONITOR, PANEL 5, SCREEN, URINE  Tachycardia Assessment & Plan: Elevated Heart Rate After discontinuing Adderall, their heart rate improved. With Concerta's potentially lower impact on heart rate, we will monitor their heart rate during Concerta use and consider supplemental medication if it remains elevated.   Other chronic pain  Insomnia, unspecified type Assessment & Plan: Insomnia They have  previously tried doxepin for insomnia. We discussed the potential impact of ADHD medications on sleep, including Concerta's long-acting nature potentially affecting sleep, but noted that ADHD medications often paradoxically improve sleep in ADHD patients. We will monitor sleep patterns with Concerta use and discuss further treatment options if insomnia persists.   Recurrent urticaria  Anxiety and depression  Irritable bowel syndrome with both constipation and diarrhea  Anxiety  Moderate episode of recurrent major depressive disorder (HCC) Assessment & Plan: Major Depressive Disorder They scored 11 on the depression scale, indicating mild depression. Previously on Lexapro, which was discontinued due to lack of efficacy, they prefer not to try new antidepressants at this time. We discussed monitoring mood and reassessing the need for psychiatric referral at the follow-up.   Eczema of both external ears  Macromastia  Positive ANA (antinuclear antibody)  Gastroesophageal reflux disease without esophagitis     Getting Answers and Following Up  Simple Questions & Concerns: For quick questions or basic follow-up after your visit, reach [place] at [phone] or MyChart messaging. Complex Concerns: If your concern is more complex, scheduling an appointment might be best. Discuss this with the staff to find the most suitable option. Lab & Imaging Results: We'll contact you directly if results are abnormal or you don't use MyChart. Most normal results will be on MyChart within 2-3 business days, with a review message from Dr. Jon Billings. Haven't heard back in 2 weeks? Need results sooner? Contact us at [phone].  Referrals: Our referral coordinator will manage specialist referrals. The specialist's office should contact you within 2 weeks to schedule an appointment. Call us if you haven't heard from them after 2 weeks.  Staying Connected  MyChart: Activate your MyChart for the fastest way  to access results and message [place]. See the last page of this paperwork for instructions on how to activate.  Billing  X-ray & Lab Orders: These are billed by separate companies. Contact the invoicing company directly for questions or concerns. Visit Charges: Discuss any billing inquiries with our administrative services team.  Feedback & Satisfaction  Share Your Experience: We strive for your satisfaction! If you have any complaints, or preferably compliments, please let Dr. Jon Billings know directly or contact our Practice Administrators, Edwena Felty or [employer], by asking at the front desk.   Scheduling Tips  Shorter Wait Times: 8 am and 1 pm appointments often have the quickest wait times. Longer Appointments: If you need more time during your visit, talk to the front desk. Due to insurance regulations, multiple back-to-back appointments might be necessary.

## 2023-04-13 NOTE — Assessment & Plan Note (Signed)
Insomnia They have previously tried doxepin for insomnia. We discussed the potential impact of ADHD medications on sleep, including Concerta's long-acting nature potentially affecting sleep, but noted that ADHD medications often paradoxically improve sleep in ADHD patients. We will monitor sleep patterns with Concerta use and discuss further treatment options if insomnia persists.

## 2023-04-13 NOTE — Assessment & Plan Note (Signed)
ADHD Due to poorly controlled ADHD symptoms following a three-month discontinuation of Adderall, which also alleviated chronic pain but elevated heart rate, we will prescribe Concerta 18 mg daily. Concerta was chosen for its potentially better balance for ADHD symptoms and heart rate control, with informed consent covering its longer duration of action (10-14 hours) compared to Adderall (6-7 hours), potential for lower heart rate impact, and cost considerations. A one-month follow-up is scheduled to monitor heart rate; if consistently above 120-130 bpm, we will consider supplemental medication.

## 2023-04-13 NOTE — Assessment & Plan Note (Signed)
Elevated Heart Rate After discontinuing Adderall, their heart rate improved. With Concerta's potentially lower impact on heart rate, we will monitor their heart rate during Concerta use and consider supplemental medication if it remains elevated.

## 2023-04-13 NOTE — Assessment & Plan Note (Signed)
Major Depressive Disorder They scored 11 on the depression scale, indicating mild depression. Previously on Lexapro, which was discontinued due to lack of efficacy, they prefer not to try new antidepressants at this time. We discussed monitoring mood and reassessing the need for psychiatric referral at the follow-up.

## 2023-04-24 ENCOUNTER — Other Ambulatory Visit: Payer: 59

## 2023-04-25 LAB — DM TEMPLATE

## 2023-04-25 LAB — DRUG MONITOR, PANEL 5, SCREEN, URINE
Amphetamines: NEGATIVE ng/mL (ref <500)
Barbiturates: NEGATIVE ng/mL (ref <300)
Benzodiazepines: NEGATIVE ng/mL (ref <100)
Cocaine Metabolite: NEGATIVE ng/mL (ref <150)
Creatinine: 152.6 mg/dL (ref >=20.0)
Marijuana Metabolite: NEGATIVE ng/mL (ref <20)
Methadone Metabolite: NEGATIVE ng/mL (ref <100)
Opiates: NEGATIVE ng/mL (ref <100)
Oxidant: NEGATIVE ug/mL (ref <200)
Oxycodone: NEGATIVE ng/mL (ref <100)
pH: 7 (ref 4.5–9.0)

## 2023-05-07 ENCOUNTER — Encounter: Payer: Self-pay | Admitting: Internal Medicine

## 2023-05-12 LAB — FAMILIAL AORTOPATHY PANEL

## 2023-05-13 ENCOUNTER — Encounter: Payer: Self-pay | Admitting: Internal Medicine

## 2023-05-17 ENCOUNTER — Ambulatory Visit: Payer: Self-pay | Admitting: Internal Medicine

## 2023-05-30 ENCOUNTER — Ambulatory Visit: Payer: 59 | Admitting: Internal Medicine

## 2023-05-30 VITALS — BP 120/88 | Temp 97.7°F | Ht 67.5 in | Wt 220.6 lb

## 2023-05-30 DIAGNOSIS — T7802XD Anaphylactic reaction due to shellfish (crustaceans), subsequent encounter: Secondary | ICD-10-CM | POA: Diagnosis not present

## 2023-05-30 DIAGNOSIS — F988 Other specified behavioral and emotional disorders with onset usually occurring in childhood and adolescence: Secondary | ICD-10-CM

## 2023-05-30 DIAGNOSIS — N62 Hypertrophy of breast: Secondary | ICD-10-CM | POA: Diagnosis not present

## 2023-05-30 DIAGNOSIS — T7802XA Anaphylactic reaction due to shellfish (crustaceans), initial encounter: Secondary | ICD-10-CM | POA: Insufficient documentation

## 2023-05-30 MED ORDER — AMPHETAMINE-DEXTROAMPHET ER 20 MG PO CP24: 20.0000 mg | ORAL_CAPSULE | ORAL | 0 refills | Status: DC

## 2023-05-30 MED ORDER — EPINEPHRINE 0.3 MG/0.3ML IJ SOAJ: 0.3000 mg | INTRAMUSCULAR | 1 refills | Status: AC | PRN

## 2023-05-30 NOTE — Assessment & Plan Note (Signed)
 Macromastia   Chronic pain from macromastia persists. She prefers a prophylactic mastectomy over reduction. A previous referral to a breast surgeon was not completed. Benefits such as pain relief and reduced breast cancer risk, along with significant risks like substantial bleeding and complex tissue removal, were discussed. She prefers Dr. Delon Myron, a plastic surgeon. Refer to Dr. Delon Myron for prophylactic mastectomy. Advise to call in two weeks if no contact from referral.

## 2023-05-30 NOTE — Patient Instructions (Signed)
 VISIT SUMMARY:  Jill Spencer, a 23 year old female, visited for a follow-up regarding ADHD and macromastia. She also needed a refill for her EpiPen  due to a shellfish allergy and discussed her improved insomnia and reduced stress after starting a new job.  YOUR PLAN:  -MACROMASTIA: Macromastia is a condition characterized by excessively large breasts, which can cause chronic pain and other issues. We discussed the benefits and risks of a prophylactic mastectomy, and you have been referred to Dr. Delon Myron, a plastic surgeon, for this procedure. Please call in two weeks if you have not been contacted regarding the referral.  -ATTENTION-DEFICIT/HYPERACTIVITY DISORDER (ADHD): ADHD is a condition that affects focus, self-control, and other important skills. Since your current medication, Concerta , has been ineffective, we have prescribed Adderall XR 20 mg once daily. You have a three-month supply, but please return in one month if it is not effective. Remember to upload pictures of nearly empty pill bottles with the date visible to MyChart for compliance.  -ANAPHYLAXIS: Anaphylaxis is a severe allergic reaction that can be life-threatening. Due to your shellfish allergy, we have prescribed Auvi-Q  high dose as an alternative to the EpiPen . Avoid eating out, especially at buffets and [employer] products, and go to the hospital immediately after using Auvi-Q .  -GENERAL HEALTH MAINTENANCE: We discussed the importance of regular follow-ups and medication compliance. Although you declined antidepressants due to past experiences, please consider them if your symptoms worsen. Your EpiPen  has been refilled.  INSTRUCTIONS:  Schedule a three-month follow-up appointment. If Adderall is not effective, call back to change to a one-month follow-up.  It was a pleasure seeing you today! Your health and satisfaction are our top priorities.  Bernardino Cone, MD  Your Providers PCP: Cone Bernardino MATSU, MD,   [phone]) Referring Provider: Cone Bernardino MATSU, MD,  [phone])     NEXT STEPS: [x]  Early Intervention: Schedule sooner appointment, call our on-call services, or go to emergency room if there is any significant Increase in pain or discomfort New or worsening symptoms Sudden or severe changes in your health [x]  Flexible Follow-Up: We recommend a No follow-ups on file. for optimal routine care. This allows for progress monitoring and treatment adjustments. [x]  Preventive Care: Schedule your annual preventive care visit! It's typically covered by insurance and helps identify potential health issues early. [x]  Lab & X-ray Appointments: Incomplete tests scheduled today, or call to schedule. X-rays: [facility] (M-F, 8:30am-noon or 1pm-5pm). [x]  Medical Information Release: Sign a release form at front desk to obtain relevant medical information we don't have.  MAKING THE MOST OF OUR FOCUSED 20 MINUTE APPOINTMENTS: [x]   Clearly state your top concerns at the beginning of the visit to focus our discussion [x]   If you anticipate you will need more time, please inform the front desk during scheduling - we can book multiple appointments in the same week. [x]   If you have transportation problems- use our convenient video appointments or ask about transportation support. [x]   We can get down to business faster if you use MyChart to update information before the visit and submit non-urgent questions before your visit. Thank you for taking the time to provide details through MyChart.  Let our nurse know and she can import this information into your encounter documents.  Arrival and Wait Times: [x]   Arriving on time ensures that everyone receives prompt attention. [x]   Early morning (8a) and afternoon (1p) appointments tend to have shortest wait times. [x]   Unfortunately, we cannot delay appointments for  late arrivals or hold slots during phone calls.  Getting Answers and  Following Up [x]   Simple Questions & Concerns: For quick questions or basic follow-up after your visit, reach us  at [phone] or MyChart messaging. [x]   Complex Concerns: If your concern is more complex, scheduling an appointment might be best. Discuss this with the staff to find the most suitable option. [x]   Lab & Imaging Results: We'll contact you directly if results are abnormal or you don't use MyChart. Most normal results will be on MyChart within 2-3 business days, with a review message from Dr. Jesus. Haven't heard back in 2 weeks? Need results sooner? Contact us  at [phone]. [x]   Referrals: Our referral coordinator will manage specialist referrals. The specialist's office should contact you within 2 weeks to schedule an appointment. Call us  if you haven't heard from them after 2 weeks.  Staying Connected [x]   MyChart: Activate your MyChart for the fastest way to access results and message us . See the last page of this paperwork for instructions on how to activate.  Bring to Your Next Appointment [x]   Medications: Please bring all your medication bottles to your next appointment to ensure we have an accurate record of your prescriptions. [x]   Health Diaries: If you're monitoring any health conditions at home, keeping a diary of your readings can be very helpful for discussions at your next appointment.  Billing [x]   X-ray & Lab Orders: These are billed by separate companies. Contact the invoicing company directly for questions or concerns. [x]   Visit Charges: Discuss any billing inquiries with our administrative services team.  Your Satisfaction Matters [x]   Share Your Experience: We strive for your satisfaction! If you have any complaints, or preferably compliments, please let Dr. Jesus know directly or contact our Practice Administrators, Manuelita Rubin or [employer], by asking at the front desk.   Reviewing Your Records [x]   Review this early draft of your clinical  encounter notes below and the final encounter summary tomorrow on MyChart after its been completed.  All orders placed so far are visible here: ADD (attention deficit disorder) without hyperactivity -     Amphetamine -Dextroamphet ER; Take 1 capsule (20 mg total) by mouth every morning.  Dispense: 30 capsule; Refill: 0 -     Amphetamine -Dextroamphet ER; Take 1 capsule (20 mg total) by mouth every morning.  Dispense: 30 capsule; Refill: 0 -     Amphetamine -Dextroamphet ER; Take 1 capsule (20 mg total) by mouth every morning.  Dispense: 30 capsule; Refill: 0 -     EPINEPHrine ; Inject 0.3 mg into the muscle as needed for anaphylaxis.  Dispense: 1 each; Refill: 1  Anaphylactic shock due to shellfish, subsequent encounter -     EPINEPHrine ; Inject 0.3 mg into the muscle as needed for anaphylaxis.  Dispense: 1 each; Refill: 1  Macromastia -     Ambulatory Referral for Breast Surgery  Need for HPV vaccine -     HPV 9-valent vaccine,Recombinat

## 2023-05-30 NOTE — Assessment & Plan Note (Signed)
 Attention-Deficit/Hyperactivity Disorder (ADHD)   Methylphenidate  (Concerta ) 18 mg has been ineffective. Improved heart rate and reduced stress are noted due to a job change. Medication adjustment options were discussed, and she prefers to retry Adderall XR 20 mg. Prescribe Adderall XR 20 mg once daily. Provide a three-month supply with instructions to return in one month if not effective. Advise to upload pictures of nearly empty pill bottles with date visible to MyChart for compliance.

## 2023-05-30 NOTE — Assessment & Plan Note (Signed)
 Anaphylaxis   Severe shellfish allergy with an expired EpiPen  was addressed. Auvi-Q  was discussed as an alternative, including its risks and benefits. A high dose could cause chest pain due to strong cardiac effects but is necessary for her severe allergy. Prescribe Auvi-Q  high dose. Advise to avoid eating out, especially at buffets and [employer] products. Instruct to go to the hospital immediately after using Auvi-Q .

## 2023-05-30 NOTE — Progress Notes (Signed)
 ==============================  [facility]: [phone]   -- Medical Office Visit --  Patient: Jill Spencer      Age: 23 y.o.       Sex:  female  Date:   05/30/2023 Today's Healthcare Provider: Bernardino KANDICE Cone, MD  ==============================   CHIEF COMPLAINT: 1 month follow-up and ADHD  SUBJECTIVE: Background This is a 23 y.o. female who has Anxiety and depression; IBS (irritable bowel syndrome); Recurrent urticaria; Acid reflux; Hypermobility arthralgia; Hypermobility syndrome; ADD (attention deficit disorder) without hyperactivity; Anxiety; Current moderate episode of major depressive disorder (HCC); Macromastia; Insomnia; Eczema of both external ears; Tachycardia; Positive ANA (antinuclear antibody); and Anaphylactic shock due to shellfish on their problem list.  Reviewed chart records that patient  has a past medical history of ADHD, Eczema, and Recurrent urticaria (05/09/2016). Discussed History of Present Illness Patient is a 23 year old individual who presents for a follow-up visit regarding ADHD and macromastia.  They are currently taking methylphenidate  (Concerta ) 18 mg for ADHD but report minimal improvement in symptoms. Their heart rate has improved, but the medication has had a low impact on their ADHD. They are considering switching back to Adderall, which they previously took at a dose of 20 mg and found effective.  They want a referral for a prophylactic mastectomy due to chronic pain associated with macromastia. They mention a previous referral attempt to a breast surgeon in February of last year, which was not completed. They are now in a better financial position to pursue this option due to a new job.  They need to refill their EpiPen  due to a shellfish allergy, which poses a risk of anaphylaxis.  Their insomnia has improved significantly over the past month, with last night being an exception. They attribute this  improvement to a reduction in stress after changing jobs.  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 This change has contributed to their improved sleep and reduced stress levels. Past Medical History - Macromastia - ADHD  Today's Verbally Confirmed Medications - Methylphenidate  (Concerta ) 18 mg Current Outpatient Medications on File Prior to Visit  Medication Sig   cetirizine (ZYRTEC) 10 MG tablet Take 10 mg by mouth daily as needed for allergies.   fluticasone  (FLONASE ) 50 MCG/ACT nasal spray Place 2 sprays into both nostrils daily as needed for allergies or rhinitis.   hydrOXYzine  (ATARAX ) 25 MG tablet Take 0.5-1 tablets (12.5-25 mg total) by mouth every 8 (eight) hours as needed for anxiety or itching (insomnia).   No current facility-administered medications on file prior to visit.   Medications Discontinued During This Encounter  Medication Reason   doxepin  (SINEQUAN ) 10 MG/ML solution    methylphenidate  (CONCERTA ) 18 MG PO CR tablet Completed Course      Objective   Physical Exam     05/30/2023   10:51 AM 04/13/2023    9:37 AM 11/03/2022    9:02 AM  Vitals with BMI  Height 5' 7.5 5' 7.5 5' 7.5  Weight 220 lbs 10 oz 218 lbs 13 oz 214 lbs 6 oz  BMI 34.02 33.74 33.06  Systolic 120 120 877  Diastolic 88 88 80  Pulse  97 69   Wt Readings from Last 10 Encounters:  05/30/23 220 lb 9.6 oz (100.1 kg)  04/13/23 218 lb 12.8 oz (99.2 kg)  11/03/22 214 lb 6.4 oz (97.3 kg)  07/26/22 217 lb 9.6 oz (98.7 kg)  07/13/22 223 lb 3.2 oz (101.2 kg)  06/14/22 222 lb 12.8 oz (101.1 kg)  12/21/21 221 lb 12.8 oz (100.6 kg)  06/04/21 214 lb 12.8 oz (97.4 kg)  02/19/21 205 lb 1.6 oz (93 kg)  01/27/21 206 lb (93.4 kg)   Vital signs reviewed.  Nursing notes reviewed. Weight trend reviewed. Abnormalities and Problem-Specific physical exam findings:  macromastia.  Vivid blue hair.    General Appearance:  No acute  distress appreciable.   Well-groomed, healthy-appearing female.  Well proportioned with no abnormal fat distribution.  Good muscle tone. Pulmonary:  Normal work of breathing at rest, no respiratory distress apparent.    Musculoskeletal: All extremities are intact.  Neurological:  Awake, alert, oriented, and engaged.  No obvious focal neurological deficits or cognitive impairments.  Sensorium seems unclouded.   Speech is clear and coherent with logical content. Psychiatric:  Appropriate mood, pleasant and cooperative demeanor, thoughtful and engaged during the exam    No results found for any visits on 05/30/23. Office Visit on 04/13/2023  Component Date Value   Genes 04/24/2023 Comment    Ethnicity 04/24/2023 Comment    Specimen Type 04/24/2023 Comment    Indication 04/24/2023 Comment    Result: 04/24/2023 Comment    Interpretation 04/24/2023 Comment    Recommendations 04/24/2023 Comment    Comments 04/24/2023 Comment    Methods/Limitations 04/24/2023 Comment    References 04/24/2023 Comment    Genes Analyzed 04/24/2023 Comment    Director Review/Release 04/24/2023 Comment    Amphetamines 04/24/2023 NEGATIVE    Barbiturates 04/24/2023 NEGATIVE    Benzodiazepines 04/24/2023 NEGATIVE    Cocaine Metabolite 04/24/2023 NEGATIVE    Marijuana Metabolite 04/24/2023 NEGATIVE    Methadone Metabolite 04/24/2023 NEGATIVE    Opiates 04/24/2023 NEGATIVE    Oxycodone 04/24/2023 NEGATIVE    Creatinine 04/24/2023 152.6    pH 04/24/2023 7.0    Oxidant 04/24/2023 NEGATIVE    Notes and Comments 04/24/2023    Office Visit on 11/03/2022  Component Date Value   Anti Nuclear Antibody (A* 11/03/2022 Negative    Sed Rate 11/03/2022 46 (H)    CRP 11/03/2022 <1.0    TSH 11/03/2022 1.76    Cholesterol 11/03/2022 142    Triglycerides 11/03/2022 189.0 (H)    HDL 11/03/2022 38.10 (L)    VLDL 11/03/2022 37.8    LDL Cholesterol 11/03/2022 66    Total CHOL/HDL Ratio 11/03/2022 4    NonHDL 11/03/2022  104.22    Hgb A1c MFr Bld 11/03/2022 5.6    Vitamin D  1, 25 (OH)2 To* 11/03/2022 40    Vitamin D3 1, 25 (OH)2 11/03/2022 40    Vitamin D2 1, 25 (OH)2 11/03/2022 <8    WBC 11/03/2022 5.7    RBC 11/03/2022 4.95    Hemoglobin 11/03/2022 12.2    HCT 11/03/2022 38.9    MCV 11/03/2022 78.6    MCHC 11/03/2022 31.4    RDW 11/03/2022 17.0 (H)    Platelets 11/03/2022 248.0    Neutrophils Relative % 11/03/2022 66.4    Lymphocytes Relative 11/03/2022 25.8    Monocytes Relative 11/03/2022 4.7    Eosinophils Relative 11/03/2022 1.9    Basophils Relative 11/03/2022 1.2    Neutro Abs 11/03/2022 3.8    Lymphs Abs 11/03/2022 1.5    Monocytes Absolute 11/03/2022 0.3    Eosinophils Absolute 11/03/2022 0.1    Basophils Absolute 11/03/2022 0.1    Sodium 11/03/2022 137    Potassium 11/03/2022 4.0    Chloride 11/03/2022 103    CO2  11/03/2022 28    Glucose, Bld 11/03/2022 108 (H)    BUN 11/03/2022 9    Creatinine, Ser 11/03/2022 0.62    Total Bilirubin 11/03/2022 0.9    Alkaline Phosphatase 11/03/2022 67    AST 11/03/2022 17    ALT 11/03/2022 15    Total Protein 11/03/2022 7.1    Albumin 11/03/2022 4.0    GFR 11/03/2022 125.64    Calcium 11/03/2022 9.4    Vitamin B-12 11/03/2022 605    Folate 11/03/2022 6.1    Total Iron  Binding Capac* 11/03/2022 370    UIBC 11/03/2022 334    Iron  11/03/2022 36    Iron  Saturation 11/03/2022 10 (L)    Ferritin 11/03/2022 10.8   No image results found. No results found.No results found.     Assessment & Plan ADD (attention deficit disorder) without hyperactivity Attention-Deficit/Hyperactivity Disorder (ADHD)   Methylphenidate  (Concerta ) 18 mg has been ineffective. Improved heart rate and reduced stress are noted due to a job change. Medication adjustment options were discussed, and she prefers to retry Adderall XR 20 mg. Prescribe Adderall XR 20 mg once daily. Provide a three-month supply with instructions to return in one month if not effective. Advise to  upload pictures of nearly empty pill bottles with date visible to MyChart for compliance. Anaphylactic shock due to shellfish, subsequent encounter Anaphylaxis   Severe shellfish allergy with an expired EpiPen  was addressed. Auvi-Q  was discussed as an alternative, including its risks and benefits. A high dose could cause chest pain due to strong cardiac effects but is necessary for her severe allergy. Prescribe Auvi-Q  high dose. Advise to avoid eating out, especially at buffets and [employer]. Instruct to go to the hospital immediately after using Auvi-Q . Macromastia Macromastia   Chronic pain from macromastia persists. She prefers a prophylactic mastectomy over reduction. A previous referral to a breast surgeon was not completed. Benefits such as pain relief and reduced breast cancer risk, along with significant risks like substantial bleeding and complex tissue removal, were discussed. She prefers Dr. Delon Myron, a plastic surgeon. Refer to Dr. Delon Myron for prophylactic mastectomy. Advise to call in two weeks if no contact from referral.  General Health Maintenance   Regular follow-up and medication compliance were discussed. Potential need for antidepressants due to current stressors was advised, but they declined, having previously tried Lexapro  without benefit. Refill EpiPen . Discuss potential use of antidepressants if symptoms worsen.  Follow-up   Schedule a three-month follow-up appointment. Advise to call back and change to a one-month follow-up if Adderall is not effective.     Orders Placed During this Encounter:   Orders Placed This Encounter  Procedures   HPV 9-valent vaccine,Recombinat   Ambulatory Referral for Breast Surgery    Referral Type:   Consultation   Meds ordered this encounter  Medications   amphetamine -dextroamphetamine (ADDERALL XR) 20 MG 24 hr capsule    Sig: Take 1 capsule (20 mg total) by mouth every morning.    Dispense:  30 capsule    Refill:   0   amphetamine -dextroamphetamine (ADDERALL XR) 20 MG 24 hr capsule    Sig: Take 1 capsule (20 mg total) by mouth every morning.    Dispense:  30 capsule    Refill:  0   amphetamine -dextroamphetamine (ADDERALL XR) 20 MG 24 hr capsule    Sig: Take 1 capsule (20 mg total) by mouth every morning.    Dispense:  30 capsule    Refill:  0   EPINEPHrine  (AUVI-Q )  0.3 mg/0.3 mL IJ SOAJ injection    Sig: Inject 0.3 mg into the muscle as needed for anaphylaxis.    Dispense:  1 each    Refill:  1       This document was synthesized by artificial intelligence ([employer]) using HIPAA-compliant recording of the clinical interaction;   We discussed the use of AI scribe software for clinical note transcription with the patient, who gave verbal consent to proceed.    Additional Info: This encounter employed state-of-the-art, real-time, collaborative documentation. The patient actively reviewed and assisted in updating their electronic medical record on a shared screen, ensuring transparency and facilitating joint problem-solving for the problem list, overview, and plan. This approach promotes accurate, informed care. The treatment plan was discussed and reviewed in detail, including medication safety, potential side effects, and all patient questions. We confirmed understanding and comfort with the plan. Follow-up instructions were established, including contacting the office for any concerns, returning if symptoms worsen, persist, or new symptoms develop, and precautions for potential emergency department visits.

## 2023-07-05 ENCOUNTER — Ambulatory Visit: Payer: 59 | Admitting: Internal Medicine

## 2023-08-15 ENCOUNTER — Ambulatory Visit: Admitting: Internal Medicine

## 2023-08-15 ENCOUNTER — Encounter: Payer: Self-pay | Admitting: Internal Medicine

## 2023-08-15 VITALS — BP 110/78 | HR 82 | Temp 97.7°F | Ht 67.5 in | Wt 226.2 lb

## 2023-08-15 DIAGNOSIS — R400 Somnolence: Secondary | ICD-10-CM | POA: Diagnosis not present

## 2023-08-15 DIAGNOSIS — N920 Excessive and frequent menstruation with regular cycle: Secondary | ICD-10-CM

## 2023-08-15 DIAGNOSIS — F988 Other specified behavioral and emotional disorders with onset usually occurring in childhood and adolescence: Secondary | ICD-10-CM | POA: Diagnosis not present

## 2023-08-15 DIAGNOSIS — Z309 Encounter for contraceptive management, unspecified: Secondary | ICD-10-CM

## 2023-08-15 DIAGNOSIS — R29818 Other symptoms and signs involving the nervous system: Secondary | ICD-10-CM

## 2023-08-15 MED ORDER — AMPHETAMINE-DEXTROAMPHET ER 15 MG PO CP24: 15.0000 mg | ORAL_CAPSULE | ORAL | 0 refills | Status: AC

## 2023-08-15 MED ORDER — NEXPLANON 68 MG ~~LOC~~ IMPL: 1.0000 | DRUG_IMPLANT | Freq: Once | SUBCUTANEOUS | 0 refills | Status: DC

## 2023-08-15 MED ORDER — AMPHETAMINE-DEXTROAMPHET ER 15 MG PO CP24
15.0000 mg | ORAL_CAPSULE | ORAL | 0 refills | Status: DC
Start: 2023-10-14 — End: 2024-02-07

## 2023-08-15 MED ORDER — MEDROXYPROGESTERONE ACETATE 150 MG/ML IM SUSP
150.0000 mg | Freq: Once | INTRAMUSCULAR | Status: AC
  Administered 2023-08-15: 150 mg via INTRAMUSCULAR

## 2023-08-15 NOTE — Assessment & Plan Note (Signed)
 ADHD is managed with Adderall XR 20 mg. She reports reasonable satisfaction with the current treatment but experiences increased heart rate. Potential adjustments to medication dosage were discussed to optimize treatment, considering sleep apnea as a contributing factor to ADHD symptoms. Continue Adderall XR 20 mg. Consider adjusting dosage to 15 mg if needed. Order a one-month supply of Adderall XR with the option to extend to three months if effective.

## 2023-08-15 NOTE — Progress Notes (Signed)
 ==============================   [facility]: [phone]   -- Medical Office Visit --  Patient: Jill Spencer      Age: 23 y.o.       Sex:  female  Date:   08/15/2023 Today's Healthcare Provider: Anthon Kins, MD  ==============================   Chief Complaint: ADD (Pt is present for refills for ADD medications follow up and oral contraceptive pill(s) discussion.)  History of Present Illness  23 year old female who presents for a follow-up visit and discussion on birth control options.  She experiences heavy menstrual periods lasting about four days, accompanied by severe cramps that have previously led to vomiting. She has not used any form of birth control before and is considering options such as Nexplanon  and Depo-Provera , with a preference for Nexplanon  despite insurance not covering it as a preferred option.  She has a history of severe menstrual cramps and heavy periods, which may be related to polycystic ovarian syndrome (PCOS), although this has not been formally diagnosed. She has not been on any specific treatment for PCOS.  Regarding her ADHD, she is currently on Adderall 20 mg and finds it effective, although she notes an increase in heart rate. 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 She feels tired all the time, even with adequate sleep, and has not been assessed for sleep apnea.  She works as a Magazine features editor, which affects her sleep schedule, making it difficult to assess her sleep quality. She reports being tired consistently, which may be related to her work schedule and potential sleep apnea.  Background Reviewed: Problem List: has Anxiety and depression; IBS (irritable bowel syndrome); Recurrent urticaria; Acid reflux; Hypermobility arthralgia; Hypermobility syndrome; ADD (attention deficit disorder) without hyperactivity; Anxiety; Current moderate  episode of major depressive disorder (HCC); Macromastia; Insomnia; Eczema of both external ears; Tachycardia; Positive ANA (antinuclear antibody); and Anaphylactic shock due to shellfish on their problem list. Medications:  has a current medication list which includes the following prescription(s): [START ON 10/14/2023] amphetamine -dextroamphetamine, [START ON 09/14/2023] amphetamine -dextroamphetamine, amphetamine -dextroamphetamine, cetirizine, epinephrine , nexplanon , fluticasone , and hydroxyzine .  Allergies:  is allergic to dust mite extract, latex, nickel, shellfish allergy, sulfa antibiotics, and trazodone  and nefazodone.  Past Medical History:  has a past medical history of ADHD, Eczema, and Recurrent urticaria (05/09/2016). Past Surgical History:   has no past surgical history on file. Social History:   reports that she has never smoked. She has been exposed to tobacco smoke. She has never used smokeless tobacco. She reports that she does not drink alcohol and does not use drugs. Family History:  family history includes Arthritis in her father; Cancer in her maternal grandfather and paternal grandfather; Congestive Heart Failure in her paternal grandmother; Diabetes in her paternal grandmother; Eczema in her paternal uncle; Hyperlipidemia in her mother; Hypertension in her father and mother; Migraines in her sister; Multiple sclerosis in an other family member. Depression Screen and Health Maintenance:    05/30/2023   10:56 AM 04/13/2023    9:42 AM 11/03/2022    9:56 AM 07/13/2022    8:56 AM  PHQ 2/9 Scores  PHQ - 2 Score 2 2 4 4   PHQ- 9 Score 7 11 13 17     Medication Reconciliation: Current Outpatient Medications on File Prior to Visit  Medication Sig   cetirizine (ZYRTEC) 10 MG tablet Take 10 mg by mouth daily as needed for allergies.   EPINEPHrine  (AUVI-Q ) 0.3  mg/0.3 mL IJ SOAJ injection Inject 0.3 mg into the muscle as needed for anaphylaxis.   fluticasone  (FLONASE ) 50 MCG/ACT nasal spray  Place 2 sprays into both nostrils daily as needed for allergies or rhinitis.   hydrOXYzine  (ATARAX ) 25 MG tablet Take 0.5-1 tablets (12.5-25 mg total) by mouth every 8 (eight) hours as needed for anxiety or itching (insomnia). (Patient not taking: Reported on 08/15/2023)   No current facility-administered medications on file prior to visit.   Medications Discontinued During This Encounter  Medication Reason   amphetamine -dextroamphetamine (ADDERALL XR) 20 MG 24 hr capsule Dose change   amphetamine -dextroamphetamine (ADDERALL XR) 20 MG 24 hr capsule Dose change   amphetamine -dextroamphetamine (ADDERALL XR) 20 MG 24 hr capsule Dose change        Physical Exam:    08/15/2023    8:08 AM 05/30/2023   10:51 AM 04/13/2023    9:37 AM  Vitals with BMI  Height 5' 7.5" 5' 7.5" 5' 7.5"  Weight 226 lbs 3 oz 220 lbs 10 oz 218 lbs 13 oz  BMI 34.88 34.02 33.74  Systolic 110 120 147  Diastolic 78 88 88  Pulse 82  97   Wt Readings from Last 10 Encounters:  08/15/23 226 lb 3.2 oz (102.6 kg)  05/30/23 220 lb 9.6 oz (100.1 kg)  04/13/23 218 lb 12.8 oz (99.2 kg)  11/03/22 214 lb 6.4 oz (97.3 kg)  07/26/22 217 lb 9.6 oz (98.7 kg)  07/13/22 223 lb 3.2 oz (101.2 kg)  06/14/22 222 lb 12.8 oz (101.1 kg)  12/21/21 221 lb 12.8 oz (100.6 kg)  06/04/21 214 lb 12.8 oz (97.4 kg)  02/19/21 205 lb 1.6 oz (93 kg)  Vital signs reviewed.  Nursing notes reviewed. Weight trend reviewed. Physical Exam  Physical Exam    General Appearance:  No acute distress appreciable.   Well-groomed, healthy-appearing female.  Well proportioned with no abnormal fat distribution.  Good muscle tone. Pulmonary:  Normal work of breathing at rest, no respiratory distress apparent. SpO2: 98 %  Musculoskeletal: All extremities are intact.  Neurological:  Awake, alert, oriented, and engaged.  No obvious focal neurological deficits or cognitive impairments.  Sensorium seems unclouded.   Speech is clear and coherent with logical  content. Psychiatric:  Appropriate mood, pleasant and cooperative demeanor, thoughtful and engaged during the exam    No results found for any visits on 08/15/23. Office Visit on 04/13/2023  Component Date Value   Genes 04/24/2023 Comment    Ethnicity 04/24/2023 Comment    Specimen Type 04/24/2023 Comment    Indication 04/24/2023 Comment    Result: 04/24/2023 Comment    Interpretation 04/24/2023 Comment    Recommendations 04/24/2023 Comment    Comments 04/24/2023 Comment    Methods/Limitations 04/24/2023 Comment    References 04/24/2023 Comment    Genes Analyzed 04/24/2023 Comment    Director Review/Release 04/24/2023 Comment    Amphetamines 04/24/2023 NEGATIVE    Barbiturates 04/24/2023 NEGATIVE    Benzodiazepines 04/24/2023 NEGATIVE    Cocaine Metabolite 04/24/2023 NEGATIVE    Marijuana Metabolite 04/24/2023 NEGATIVE    Methadone Metabolite 04/24/2023 NEGATIVE    Opiates 04/24/2023 NEGATIVE    Oxycodone 04/24/2023 NEGATIVE    Creatinine 04/24/2023 152.6    pH 04/24/2023 7.0    Oxidant 04/24/2023 NEGATIVE    Notes and Comments 04/24/2023    Office Visit on 11/03/2022  Component Date Value   Anti Nuclear Antibody (A* 11/03/2022 Negative    Sed Rate 11/03/2022 46 (H)    CRP 11/03/2022 <  1.0    TSH 11/03/2022 1.76    Cholesterol 11/03/2022 142    Triglycerides 11/03/2022 189.0 (H)    HDL 11/03/2022 38.10 (L)    VLDL 11/03/2022 37.8    LDL Cholesterol 11/03/2022 66    Total CHOL/HDL Ratio 11/03/2022 4    NonHDL 11/03/2022 104.22    Hgb A1c MFr Bld 11/03/2022 5.6    Vitamin D  1, 25 (OH)2 To* 11/03/2022 40    Vitamin D3 1, 25 (OH)2 11/03/2022 40    Vitamin D2 1, 25 (OH)2 11/03/2022 <8    WBC 11/03/2022 5.7    RBC 11/03/2022 4.95    Hemoglobin 11/03/2022 12.2    HCT 11/03/2022 38.9    MCV 11/03/2022 78.6    MCHC 11/03/2022 31.4    RDW 11/03/2022 17.0 (H)    Platelets 11/03/2022 248.0    Neutrophils Relative % 11/03/2022 66.4    Lymphocytes Relative 11/03/2022 25.8     Monocytes Relative 11/03/2022 4.7    Eosinophils Relative 11/03/2022 1.9    Basophils Relative 11/03/2022 1.2    Neutro Abs 11/03/2022 3.8    Lymphs Abs 11/03/2022 1.5    Monocytes Absolute 11/03/2022 0.3    Eosinophils Absolute 11/03/2022 0.1    Basophils Absolute 11/03/2022 0.1    Sodium 11/03/2022 137    Potassium 11/03/2022 4.0    Chloride 11/03/2022 103    CO2 11/03/2022 28    Glucose, Bld 11/03/2022 108 (H)    BUN 11/03/2022 9    Creatinine, Ser 11/03/2022 0.62    Total Bilirubin 11/03/2022 0.9    Alkaline Phosphatase 11/03/2022 67    AST 11/03/2022 17    ALT 11/03/2022 15    Total Protein 11/03/2022 7.1    Albumin 11/03/2022 4.0    GFR 11/03/2022 125.64    Calcium 11/03/2022 9.4    Vitamin B-12 11/03/2022 605    Folate 11/03/2022 6.1    Total Iron Binding Capac* 11/03/2022 370    UIBC 11/03/2022 334    Iron 11/03/2022 36    Iron Saturation 11/03/2022 10 (L)    Ferritin 11/03/2022 10.8   No image results found. No results found.    Results      Assessment & Plan Menorrhagia with regular cycle She experiences heavy menstrual bleeding with associated fatigue and severe cramps. Birth control options, including Nexplanon  and Depo-Provera , were discussed. She prefers Nexplanon  for long-term effectiveness but is open to starting with Depo-Provera  due to insurance coverage issues. Depo-Provera  has a 50% chance of no bleeding by year one and a 70% chance by year two, compared to Nexplanon 's 30-50% chance of stopping periods. Nexplanon  may cause unpredictable bleeding, and Depo-Provera  may delay pregnancy for up to 18 months after stopping. Administer Depo-Provera  150 mg IM for immediate management of heavy menstrual bleeding and contraception. Order Nexplanon  and coordinate with insurance for coverage. If not covered, refer to [facility] for potential free access. Encounter for contraceptive management, unspecified type After extensive shared decision making, agreed  to give depo shot today while working on getting nexplanon  approved via insurance  She currently menstruating within 7 days of start of cycle so no human chorionic gonadotropin  test required  Drowsiness Suspected sleep apnea may be contributing to drowsiness and exacerbating ADHD symptoms. Diagnosing and treating sleep apnea is crucial for improving ADHD management and reducing tachycardia. If sleep apnea is confirmed, Zepbound may be used for weight loss, potentially aiding in reducing heavy menstrual bleeding. Order a home sleep test to evaluate for sleep apnea. Consider  treatment with Zepbound if confirmed. ADD (attention deficit disorder) without hyperactivity ADHD is managed with Adderall XR 20 mg. She reports reasonable satisfaction with the current treatment but experiences increased heart rate. Potential adjustments to medication dosage were discussed to optimize treatment, considering sleep apnea as a contributing factor to ADHD symptoms. Continue Adderall XR 20 mg. Consider adjusting dosage to 15 mg if needed. Order a one-month supply of Adderall XR with the option to extend to three months if effective. Suspected sleep apnea Suspected sleep apnea may be contributing to drowsiness and exacerbating ADHD symptoms. Diagnosing and treating sleep apnea is crucial for improving ADHD management and reducing tachycardia. If sleep apnea is confirmed, Zepbound may be used for weight loss, potentially aiding in reducing heavy menstrual bleeding. Order a home sleep test to evaluate for sleep apnea. Consider treatment with Zepbound if confirmed.         Orders Placed During this Encounter:   Orders Placed This Encounter  Procedures   Home sleep test    Drowsiness, concentration deficit, BMI 34, snores.    Where should this test be performed::   [facility]   Meds ordered this encounter  Medications   etonogestrel  (NEXPLANON ) 68 MG IMPL implant    Sig: 1 each (68 mg total) by  Subdermal route once for 1 dose.    Dispense:  1 each    Refill:  0   medroxyPROGESTERone  (DEPO-PROVERA ) injection 150 mg   amphetamine -dextroamphetamine (ADDERALL XR) 15 MG 24 hr capsule    Sig: Take 1 capsule by mouth every morning.    Dispense:  90 capsule    Refill:  0   amphetamine -dextroamphetamine (ADDERALL XR) 15 MG 24 hr capsule    Sig: Take 1 capsule by mouth every morning.    Dispense:  30 capsule    Refill:  0   amphetamine -dextroamphetamine (ADDERALL XR) 15 MG 24 hr capsule    Sig: Take 1 capsule by mouth every morning.    Dispense:  30 capsule    Refill:  0             Additional Info: This encounter employed state-of-the-art, real-time, collaborative documentation. The patient actively reviewed and assisted in updating their electronic medical record on a shared screen, ensuring transparency and facilitating joint problem-solving for the problem list, overview, and plan. This approach promotes accurate, informed care. The treatment plan was discussed and reviewed in detail, including medication safety, potential side effects, and all patient questions. We confirmed understanding and comfort with the plan. Follow-up instructions were established, including contacting the office for any concerns, returning if symptoms worsen, persist, or new symptoms develop, and precautions for potential emergency department visits.   This document was synthesized by artificial intelligence (Abridge) using HIPAA-compliant recording of the clinical interaction;   We discussed the use of AI scribe software for clinical note transcription with the patient, who gave verbal consent to proceed.

## 2023-08-15 NOTE — Patient Instructions (Signed)
 VISIT SUMMARY:  During your visit, we discussed your heavy menstrual periods, ADHD management, and potential sleep apnea. We reviewed birth control options, and you expressed a preference for Nexplanon  despite insurance challenges. We also addressed your ADHD treatment and the possibility of sleep apnea affecting your symptoms.  YOUR PLAN:  -SLEEP APNEA (SUSPECTED): Sleep apnea is a condition where breathing repeatedly stops and starts during sleep, leading to poor sleep quality and daytime drowsiness. We will conduct a home sleep test to evaluate for sleep apnea. If confirmed, treatment options, including weight loss medication like Zepbound, will be considered to help manage your symptoms and potentially reduce heavy menstrual bleeding.  -ATTENTION-DEFICIT/HYPERACTIVITY DISORDER (ADHD): ADHD is a condition characterized by symptoms of inattention, hyperactivity, and impulsivity. You are currently managing it with Adderall XR 20 mg, which you find effective but notice an increased heart rate. We discussed the possibility of adjusting your dosage to 15 mg if needed. We will continue with the current dosage for now and reassess as necessary. A one-month supply of Adderall XR has been ordered, with the option to extend to three months if it proves effective.  -HEAVY MENSTRUAL BLEEDING: Heavy menstrual bleeding can cause significant discomfort and fatigue. We discussed birth control options to help manage this, including Nexplanon  and Depo-Provera . Due to insurance coverage, we will start with Depo-Provera , which has a high chance of reducing or stopping bleeding over time. You received a Depo-Provera  150 mg injection today. We will also order Nexplanon  and coordinate with your insurance for coverage. If Nexplanon  is not covered, we will refer you to [facility] for potential free access.  INSTRUCTIONS:  Please complete the home sleep test as soon as possible to evaluate for sleep apnea. Continue  taking Adderall XR 20 mg as prescribed, and monitor your heart rate. If you experience any issues or side effects, contact our office. Follow up with us  to discuss the results of your sleep test and to reassess your ADHD treatment and menstrual bleeding management. If Nexplanon  is not covered by your insurance, we will refer you to [facility] for further assistance.  It was a pleasure seeing you today! Your health and satisfaction are our top priorities.  Scherrie Curt, MD  Your Providers PCP: Anthon Kins, MD,  [phone]) Referring Provider: Anthon Kins, MD,  [phone])     NEXT STEPS: [x]  Early Intervention: Schedule sooner appointment, call our on-call services, or go to emergency room if there is any significant Increase in pain or discomfort New or worsening symptoms Sudden or severe changes in your health [x]  Flexible Follow-Up: We recommend a No follow-ups on file. for optimal routine care. This allows for progress monitoring and treatment adjustments. [x]  Preventive Care: Schedule your annual preventive care visit! It's typically covered by insurance and helps identify potential health issues early. [x]  Lab & X-ray Appointments: Incomplete tests scheduled today, or call to schedule. X-rays:  [facility] (M-F, 8:30am-noon or 1pm-5pm). [x]  Medical Information Release: Sign a release form at front desk to obtain relevant medical information we don't have.  MAKING THE MOST OF OUR FOCUSED 20 MINUTE APPOINTMENTS: [x]   Clearly state your top concerns at the beginning of the visit to focus our discussion [x]   If you anticipate you will need more time, please inform the front desk during scheduling - we can book multiple appointments in the same week. [x]   If you have transportation problems- use our convenient video appointments or ask about transportation support. [x]   We  can get down to business faster if you use MyChart to update information before  the visit and submit non-urgent questions before your visit. Thank you for taking the time to provide details through MyChart.  Let our nurse know and she can import this information into your encounter documents.  Arrival and Wait Times: [x]   Arriving on time ensures that everyone receives prompt attention. [x]   Early morning (8a) and afternoon (1p) appointments tend to have shortest wait times. [x]   Unfortunately, we cannot delay appointments for late arrivals or hold slots during phone calls.  Getting Answers and Following Up [x]   Simple Questions & Concerns: For quick questions or basic follow-up after your visit, reach us  at [phone] or MyChart messaging. [x]   Complex Concerns: If your concern is more complex, scheduling an appointment might be best. Discuss this with the staff to find the most suitable option. [x]   Lab & Imaging Results: We'll contact you directly if results are abnormal or you don't use MyChart. Most normal results will be on MyChart within 2-3 business days, with a review message from Dr. Boston Byers. Haven't heard back in 2 weeks? Need results sooner? Contact us  at [phone]. [x]   Referrals: Our referral coordinator will manage specialist referrals. The specialist's office should contact you within 2 weeks to schedule an appointment. Call us  if you haven't heard from them after 2 weeks.  Staying Connected [x]   MyChart: Activate your MyChart for the fastest way to access results and message us . See the last page of this paperwork for instructions on how to activate.  Bring to Your Next Appointment [x]   Medications: Please bring all your medication bottles to your next appointment to ensure we have an accurate record of your prescriptions. [x]   Health Diaries: If you're monitoring any health conditions at home, keeping a diary of your readings can be very helpful for discussions at your next appointment.  Billing [x]   X-ray & Lab Orders: These are billed by  separate companies. Contact the invoicing company directly for questions or concerns. [x]   Visit Charges: Discuss any billing inquiries with our administrative services team.  Your Satisfaction Matters [x]   Share Your Experience: We strive for your satisfaction! If you have any complaints, or preferably compliments, please let Dr. Boston Byers know directly or contact our Practice Administrators, Olinda Bertrand or [employer], by asking at the front desk.   Reviewing Your Records [x]   Review this early draft of your clinical encounter notes below and the final encounter summary tomorrow on MyChart after its been completed.  All orders placed so far are visible here: Menorrhagia with regular cycle  Encounter for contraceptive management, unspecified type -     Nexplanon ; 1 each (68 mg total) by Subdermal route once for 1 dose.  Dispense: 1 each; Refill: 0 -     medroxyPROGESTERone  Acetate  Drowsiness -     Home sleep test  ADD (attention deficit disorder) without hyperactivity -     Amphetamine -Dextroamphet ER; Take 1 capsule by mouth every morning.  Dispense: 90 capsule; Refill: 0 -     Amphetamine -Dextroamphet ER; Take 1 capsule by mouth every morning.  Dispense: 30 capsule; Refill: 0 -     Amphetamine -Dextroamphet ER; Take 1 capsule by mouth every morning.  Dispense: 30 capsule; Refill: 0  Suspected sleep apnea

## 2023-08-18 ENCOUNTER — Ambulatory Visit: Admitting: Internal Medicine

## 2023-08-29 ENCOUNTER — Ambulatory Visit: Payer: 59 | Admitting: Internal Medicine

## 2023-09-06 ENCOUNTER — Encounter: Payer: Self-pay | Admitting: Internal Medicine

## 2023-11-13 ENCOUNTER — Ambulatory Visit: Admitting: Internal Medicine

## 2023-11-13 VITALS — BP 110/70 | HR 70 | Temp 98.0°F | Ht 67.5 in | Wt 223.6 lb

## 2023-11-13 DIAGNOSIS — Z309 Encounter for contraceptive management, unspecified: Secondary | ICD-10-CM | POA: Diagnosis not present

## 2023-11-13 DIAGNOSIS — F988 Other specified behavioral and emotional disorders with onset usually occurring in childhood and adolescence: Secondary | ICD-10-CM

## 2023-11-13 DIAGNOSIS — F331 Major depressive disorder, recurrent, moderate: Secondary | ICD-10-CM

## 2023-11-13 DIAGNOSIS — R29818 Other symptoms and signs involving the nervous system: Secondary | ICD-10-CM | POA: Diagnosis not present

## 2023-11-13 DIAGNOSIS — N921 Excessive and frequent menstruation with irregular cycle: Secondary | ICD-10-CM

## 2023-11-13 LAB — IRON,TIBC AND FERRITIN PANEL
%SAT: 5 % — ABNORMAL LOW (ref 16–45)
Ferritin: 13 ng/mL — ABNORMAL LOW (ref 16–154)
Iron: 20 ug/dL — ABNORMAL LOW (ref 40–190)
TIBC: 372 ug/dL (ref 250–450)

## 2023-11-13 LAB — COMPREHENSIVE METABOLIC PANEL WITH GFR
ALT: 11 U/L (ref 0–35)
AST: 16 U/L (ref 0–37)
Albumin: 4.3 g/dL (ref 3.5–5.2)
Alkaline Phosphatase: 63 U/L (ref 39–117)
BUN: 6 mg/dL (ref 6–23)
CO2: 26 meq/L (ref 19–32)
Calcium: 9.5 mg/dL (ref 8.4–10.5)
Chloride: 104 meq/L (ref 96–112)
Creatinine, Ser: 0.64 mg/dL (ref 0.40–1.20)
GFR: 123.79 mL/min (ref >60.00)
Glucose, Bld: 104 mg/dL — ABNORMAL HIGH (ref 70–99)
Potassium: 3.8 meq/L (ref 3.5–5.1)
Sodium: 138 meq/L (ref 135–145)
Total Bilirubin: 0.5 mg/dL (ref 0.2–1.2)
Total Protein: 7.9 g/dL (ref 6.0–8.3)

## 2023-11-13 LAB — CBC WITH DIFFERENTIAL/PLATELET
Basophils Absolute: 0.1 K/uL (ref 0.0–0.1)
Basophils Relative: 1.3 % (ref 0.0–3.0)
Eosinophils Absolute: 0.1 K/uL (ref 0.0–0.7)
Eosinophils Relative: 1.8 % (ref 0.0–5.0)
HCT: 39.8 % (ref 36.0–46.0)
Hemoglobin: 12.6 g/dL (ref 12.0–15.0)
Lymphocytes Relative: 24.4 % (ref 12.0–46.0)
Lymphs Abs: 1.7 K/uL (ref 0.7–4.0)
MCHC: 31.6 g/dL (ref 30.0–36.0)
MCV: 75.8 fl — ABNORMAL LOW (ref 78.0–100.0)
Monocytes Absolute: 0.4 K/uL (ref 0.1–1.0)
Monocytes Relative: 5.2 % (ref 3.0–12.0)
Neutro Abs: 4.7 K/uL (ref 1.4–7.7)
Neutrophils Relative %: 67.3 % (ref 43.0–77.0)
Platelets: 279 K/uL (ref 150.0–400.0)
RBC: 5.26 Mil/uL — ABNORMAL HIGH (ref 3.87–5.11)
RDW: 16.1 % — ABNORMAL HIGH (ref 11.5–15.5)
WBC: 7 K/uL (ref 4.0–10.5)

## 2023-11-13 MED ORDER — BUPROPION HCL ER (SR) 100 MG PO TB12: 100.0000 mg | ORAL_TABLET | Freq: Every morning | ORAL | 3 refills | Status: AC

## 2023-11-13 MED ORDER — FLUOXETINE HCL (PMDD) 20 MG PO TABS: 1.0000 | ORAL_TABLET | Freq: Every day | ORAL | 0 refills | Status: DC

## 2023-11-13 MED ORDER — NEXPLANON 68 MG ~~LOC~~ IMPL: 1.0000 | DRUG_IMPLANT | Freq: Once | SUBCUTANEOUS | 0 refills | Status: AC

## 2023-11-13 MED ORDER — MEDROXYPROGESTERONE ACETATE 150 MG/ML IM SUSP
150.0000 mg | Freq: Once | INTRAMUSCULAR | Status: AC
  Administered 2023-11-13: 150 mg via INTRAMUSCULAR

## 2023-11-13 NOTE — Assessment & Plan Note (Signed)
 She reports lack of motivation and enjoyment, consistent with depression, and Lexapro  was ineffective. Anxiety is present but less severe. Wellbutrin  and Prozac  were discussed, considering side effects and benefits. Wellbutrin  will be prescribed initially for one week to assess response, with Prozac  to be added if Wellbutrin  is ineffective. Potential side effects, including weight changes and impact on libido, were discussed. No referral to psychiatry or counseling is planned at this time, per her preference.

## 2023-11-13 NOTE — Patient Instructions (Addendum)
 VISIT SUMMARY:  Today, you came in for a medication refill and to discuss contraception management. You mentioned experiencing persistent spotting for two months, likely due to your contraceptive implant, and expressed concerns about potential anemia. We also discussed your current medications, sleep issues, and interest in antidepressant options.  YOUR PLAN:  -MENORRHAGIA WITH REGULAR CYCLE: Menorrhagia means heavy or prolonged menstrual bleeding. You have been experiencing continuous, light bleeding for two months, likely due to your contraceptive implant. We will do blood work to check your blood counts, liver, kidneys, and iron  levels. You received a Depo-Provera  injection today for immediate contraception. We will also check with [facility] about the availability of your Nexplanon  for your next visit.  It was a pleasure seeing you today! Your health and satisfaction are our top priorities.  Bernardino Cone, MD  Your Providers PCP: Cone Bernardino MATSU, MD,  [phone]) Referring Provider: Cone Bernardino MATSU, MD,  [phone])     NEXT STEPS: [x]  Early Intervention: Schedule sooner appointment, call our on-call services, or go to emergency room if there is any significant Increase in pain or discomfort New or worsening symptoms Sudden or severe changes in your health [x]  Flexible Follow-Up: We recommend a Return in about 2 months (around 01/18/2024) for nexplanon  plus adhd meds.. for optimal routine care. This allows for progress monitoring and treatment adjustments. [x]  Preventive Care: Schedule your annual preventive care visit! It's typically covered by insurance and helps identify potential health issues early. [x]  Lab & X-ray Appointments: Incomplete tests scheduled today, or call to schedule. X-rays: [facility] (M-F, 8:30am-noon or 1pm-5pm). [x]  Medical Information Release: Sign a release form at front desk to obtain relevant medical information we don't have.  MAKING THE  MOST OF OUR FOCUSED 20 MINUTE APPOINTMENTS: [x]   Clearly state your top concerns at the beginning of the visit to focus our discussion [x]   If you anticipate you will need more time, please inform the front desk during scheduling - we can book multiple appointments in the same week. [x]   If you have transportation problems- use our convenient video appointments or ask about transportation support. [x]   We can get down to business faster if you use MyChart to update information before the visit and submit non-urgent questions before your visit. Thank you for taking the time to provide details through MyChart.  Let our nurse know and she can import this information into your encounter documents.  Arrival and Wait Times: [x]   Arriving on time ensures that everyone receives prompt attention. [x]   Early morning (8a) and afternoon (1p) appointments tend to have shortest wait times. [x]   Unfortunately, we cannot delay appointments for late arrivals or hold slots during phone calls.  Getting Answers and Following Up [x]   Simple Questions & Concerns: For quick questions or basic follow-up after your visit, reach us  at [phone] or MyChart messaging. [x]   Complex Concerns: If your concern is more complex, scheduling an appointment might be best. Discuss this with the staff to find the most suitable option. [x]   Lab & Imaging Results: We'll contact you directly if results are abnormal or you don't use MyChart. Most normal results will be on MyChart within 2-3 business days, with a review message from Dr. Cone. Haven't heard back in 2 weeks? Need results sooner? Contact us  at [phone]. [x]   Referrals: Our referral coordinator will manage specialist referrals. The specialist's office should contact you within 2 weeks to schedule an appointment. Call us  if you haven't heard  from them after 2 weeks.  Staying Connected [x]   MyChart: Activate your MyChart for the fastest way to access results and  message us . See the last page of this paperwork for instructions on how to activate.  Bring to Your Next Appointment [x]   Medications: Please bring all your medication bottles to your next appointment to ensure we have an accurate record of your prescriptions. [x]   Health Diaries: If you're monitoring any health conditions at home, keeping a diary of your readings can be very helpful for discussions at your next appointment.  Billing [x]   X-ray & Lab Orders: These are billed by separate companies. Contact the invoicing company directly for questions or concerns. [x]   Visit Charges: Discuss any billing inquiries with our administrative services team.  Your Satisfaction Matters [x]   Share Your Experience: We strive for your satisfaction! If you have any complaints, or preferably compliments, please let Dr. Jesus know directly or contact our Practice Administrators, Manuelita Rubin or [employer], by asking at the front desk.   Reviewing Your Records [x]   Review this early draft of your clinical encounter notes below and the final encounter summary tomorrow on MyChart after its been completed.  All orders placed so far are visible here: Moderate episode of recurrent major depressive disorder (HCC) -     FLUoxetine  HCl (PMDD); Take 1 tablet (20 mg total) by mouth daily at 6 (six) AM. Start at half tablet daily for first 2 weeks. Wait until 1 week on Wellbutrin  prior to start.  Dispense: 90 tablet; Refill: 0 -     buPROPion  HCl ER (SR); Take 1 tablet (100 mg total) by mouth in the morning.  Dispense: 90 tablet; Refill: 3  ADD (attention deficit disorder) without hyperactivity  Suspected sleep apnea -     Home sleep test  Encounter for contraceptive management, unspecified type -     medroxyPROGESTERone  Acetate -     Nexplanon ; 1 each (68 mg total) by Subdermal route once for 1 dose.  Dispense: 1 each; Refill: 0  Menorrhagia with irregular cycle -     CBC with Differential/Platelet -      Comprehensive metabolic panel with GFR -     Iron , TIBC and Ferritin Panel -     medroxyPROGESTERone  Acetate        -CURRENT MODERATE EPISODE OF MAJOR DEPRESSIVE DISORDER: Major depressive disorder is a condition characterized by persistent feelings of sadness and loss of interest. You reported a lack of motivation and enjoyment, which are signs of depression. We discussed Wellbutrin  and Prozac  as treatment options. You will start with Wellbutrin  for one week to see how you respond. If it is not effective, we will consider adding Prozac . We talked about potential side effects, including weight changes and impact on libido.  -ADD (ATTENTION DEFICIT DISORDER) WITHOUT HYPERACTIVITY: ADD is a condition that affects your ability to focus and pay attention. You are currently taking Adderall 15 mg, which is working well for you. We will provide a 90-day supply, with your next refill planned for January 23, 2024.  -SUSPECTED SLEEP APNEA: Sleep apnea is a condition where breathing repeatedly stops and starts during sleep. You reported snoring and daytime drowsiness, which may indicate sleep apnea. We will order a home sleep study to evaluate this further.  INSTRUCTIONS:  Please follow up with the blood work as discussed to check your blood counts, liver, kidneys, and iron  levels. We will also follow up on the status of your [facility]  order [facility].  Start taking Wellbutrin  as prescribed and monitor for any side effects. If you do not notice any improvement, we may consider adding Prozac . Complete the home sleep study to evaluate for sleep apnea. Your next Adderall refill is planned for January 23, 2024.

## 2023-11-13 NOTE — Progress Notes (Signed)
 ==============================  [facility]: [phone]   -- Medical Office Visit --  Patient: Jill Spencer      Age: 23 y.o.       Sex:  female  Date:   11/13/2023 Today's Healthcare Provider: Bernardino KANDICE Cone, MD  ==============================   Chief Complaint: Medication Refill and Contraception (Pt has had period for going on two mths in row non stop.)  Discussed the use of AI scribe software for clinical note transcription with the patient, who gave verbal consent to proceed.  History of Present Illness  23 year old female who presents for medication refill and contraception management.  She has been experiencing persistent spotting for two months, which she attributes to her current contraceptive implant. There is no heavy bleeding, but she is concerned about potential anemia due to her history and the prolonged bleeding. She denies worsening dizziness, shortness of breath, or severe fatigue beyond her usual experience.  She is currently taking Adderall 15 mg and reports improved sleep on this dose. She is no longer using hydroxyzine  and has not needed her EpiPen  recently. She had a suspected kidney infection in March, which she believes has resolved as she reports painless urination.  There is a logistical issue with obtaining the [facility].  She experiences daytime drowsiness and constant tiredness, which she attributes to potential sleep apnea. She acknowledges snoring but is unaware of any observed apneic episodes. She is interested in a home sleep test to evaluate for sleep apnea.  She is interested in discussing antidepressant options. She has a family history of Lexapro  use for depression but did not find it effective for herself after a year of use. She describes a lack of motivation and enjoyment in activities, which she associates with depression. Her anxiety is not as severe as it used to be.   Background  Reviewed: Problem List: has Anxiety and depression; IBS (irritable bowel syndrome); Recurrent urticaria; Acid reflux; Hypermobility arthralgia; Hypermobility syndrome; ADD (attention deficit disorder) without hyperactivity; Anxiety; Current moderate episode of major depressive disorder (HCC); Macromastia; Insomnia; Eczema of both external ears; Tachycardia; Positive ANA (antinuclear antibody); Anaphylactic shock due to shellfish; and Menorrhagia with irregular cycle on their problem list. Past Medical History:  has a past medical history of ADHD, Eczema, and Recurrent urticaria (05/09/2016). Past Surgical History:   has no past surgical history on file. Social History:   reports that she has never smoked. She has been exposed to tobacco smoke. She has never used smokeless tobacco. She reports that she does not drink alcohol and does not use drugs. Family History:  family history includes Arthritis in her father; Cancer in her maternal grandfather and paternal grandfather; Congestive Heart Failure in her paternal grandmother; Diabetes in her paternal grandmother; Eczema in her paternal uncle; Hyperlipidemia in her mother; Hypertension in her father and mother; Migraines in her sister; Multiple sclerosis in an other family member. Allergies:  is allergic to dust mite extract, latex, nickel, shellfish allergy, sulfa antibiotics, and trazodone  and nefazodone.   Medication Reconciliation: Current Outpatient Medications on File Prior to Visit  Medication Sig   amphetamine -dextroamphetamine (ADDERALL XR) 15 MG 24 hr capsule Take 1 capsule by mouth every morning.   cetirizine (ZYRTEC) 10 MG tablet Take 10 mg by mouth daily as needed for allergies.   EPINEPHrine  (AUVI-Q ) 0.3 mg/0.3 mL IJ SOAJ injection Inject 0.3 mg into the muscle as needed for anaphylaxis.   fluticasone  (FLONASE ) 50 MCG/ACT nasal spray Place  2 sprays into both nostrils daily as needed for allergies or rhinitis.   amphetamine -dextroamphetamine  (ADDERALL XR) 15 MG 24 hr capsule Take 1 capsule by mouth every morning.   amphetamine -dextroamphetamine (ADDERALL XR) 15 MG 24 hr capsule Take 1 capsule by mouth every morning.   No current facility-administered medications on file prior to visit.   Medications Discontinued During This Encounter  Medication Reason   hydrOXYzine  (ATARAX ) 25 MG tablet Completed Course   etonogestrel  (NEXPLANON ) 68 MG IMPL implant Reorder     Physical Exam:    11/13/2023    9:58 AM 08/15/2023    8:08 AM 05/30/2023   10:51 AM  Vitals with BMI  Height 5' 7.5 5' 7.5 5' 7.5  Weight 223 lbs 10 oz 226 lbs 3 oz 220 lbs 10 oz  BMI 34.48 34.88 34.02  Systolic 110 110 879  Diastolic 70 78 88  Pulse 70 82   Vital signs reviewed.  Nursing notes reviewed. Weight trend reviewed. Physical Exam General Appearance:  No acute distress appreciable.   Well-groomed, healthy-appearing female.  Well proportioned with no abnormal fat distribution.  Good muscle tone. Pulmonary:  Normal work of breathing at rest, no respiratory distress apparent. SpO2: 98 %  Musculoskeletal: All extremities are intact.  Neurological:  Awake, alert, oriented, and engaged.  No obvious focal neurological deficits or cognitive impairments.  Sensorium seems unclouded.   Speech is clear and coherent with logical content. Psychiatric:  Appropriate mood, pleasant and cooperative demeanor, thoughtful and engaged during the exam      11/13/2023   10:25 AM 05/30/2023   10:56 AM 04/13/2023    9:43 AM 11/03/2022    9:57 AM  GAD 7 : Generalized Anxiety Score  Nervous, Anxious, on Edge 1 1 2 2   Control/stop worrying 1 2 2 2   Worry too much - different things 1 2 2 2   Trouble relaxing 3 2 3 2   Restless 1 0 2 1  Easily annoyed or irritable 0 0 1 1  Afraid - awful might happen 0 0 0 2  Total GAD 7 Score 7 7 12 12   Anxiety Difficulty  Somewhat difficult Somewhat difficult Not difficult at all      11/13/2023   10:24 AM 11/13/2023   10:01 AM 05/30/2023    10:56 AM  Depression screen PHQ 2/9  Decreased Interest 3 1 1   Down, Depressed, Hopeless 2 0 1  PHQ - 2 Score 5 1 2   Altered sleeping 1 1 0  Tired, decreased energy 3 0 1  Change in appetite 2 0 3  Feeling bad or failure about yourself  0 0 0  Trouble concentrating 3 0 1  Moving slowly or fidgety/restless 0 0 0  Suicidal thoughts 0 0 0  PHQ-9 Score 14 2 7   Difficult doing work/chores Somewhat difficult Not difficult at all Somewhat difficult    Office Visit on 11/13/2023  Component Date Value Ref Range Status   WBC 11/13/2023 7.0  4.0 - 10.5 K/uL Final   RBC 11/13/2023 5.26 (H)  3.87 - 5.11 Mil/uL Final   Hemoglobin 11/13/2023 12.6  12.0 - 15.0 g/dL Final   HCT 92/78/7974 39.8  36.0 - 46.0 % Final   MCV 11/13/2023 75.8 (L)  78.0 - 100.0 fl Final   MCHC 11/13/2023 31.6  30.0 - 36.0 g/dL Final   RDW 92/78/7974 16.1 (H)  11.5 - 15.5 % Final   Platelets 11/13/2023 279.0  150.0 - 400.0 K/uL Final   Neutrophils Relative %  11/13/2023 67.3  43.0 - 77.0 % Final   Lymphocytes Relative 11/13/2023 24.4  12.0 - 46.0 % Final   Monocytes Relative 11/13/2023 5.2  3.0 - 12.0 % Final   Eosinophils Relative 11/13/2023 1.8  0.0 - 5.0 % Final   Basophils Relative 11/13/2023 1.3  0.0 - 3.0 % Final   Neutro Abs 11/13/2023 4.7  1.4 - 7.7 K/uL Final   Lymphs Abs 11/13/2023 1.7  0.7 - 4.0 K/uL Final   Monocytes Absolute 11/13/2023 0.4  0.1 - 1.0 K/uL Final   Eosinophils Absolute 11/13/2023 0.1  0.0 - 0.7 K/uL Final   Basophils Absolute 11/13/2023 0.1  0.0 - 0.1 K/uL Final   Sodium 11/13/2023 138  135 - 145 mEq/L Final   Potassium 11/13/2023 3.8  3.5 - 5.1 mEq/L Final   Chloride 11/13/2023 104  96 - 112 mEq/L Final   CO2 11/13/2023 26  19 - 32 mEq/L Final   Glucose, Bld 11/13/2023 104 (H)  70 - 99 mg/dL Final   BUN 92/78/7974 6  6 - 23 mg/dL Final   Creatinine, Ser 11/13/2023 0.64  0.40 - 1.20 mg/dL Final   Total Bilirubin 11/13/2023 0.5  0.2 - 1.2 mg/dL Final   Alkaline Phosphatase 11/13/2023 63   39 - 117 U/L Final   AST 11/13/2023 16  0 - 37 U/L Final   ALT 11/13/2023 11  0 - 35 U/L Final   Total Protein 11/13/2023 7.9  6.0 - 8.3 g/dL Final   Albumin 92/78/7974 4.3  3.5 - 5.2 g/dL Final   GFR 92/78/7974 123.79  >60.00 mL/min Final   Calcium 11/13/2023 9.5  8.4 - 10.5 mg/dL Final  Office Visit on 04/13/2023  Component Date Value Ref Range Status   Genes 04/24/2023 Comment   Final   Ethnicity 04/24/2023 Comment   Final   Specimen Type 04/24/2023 Comment   Final   Indication 04/24/2023 Comment   Final   Result: 04/24/2023 Comment   Final   Interpretation 04/24/2023 Comment   Final   Recommendations 04/24/2023 Comment   Final   Comments 04/24/2023 Comment   Final   Methods/Limitations 04/24/2023 Comment   Final   References 04/24/2023 Comment   Final   Genes Analyzed 04/24/2023 Comment   Final   Director Review/Release 04/24/2023 Comment   Final   Amphetamines 04/24/2023 NEGATIVE  <500 ng/mL Final   Barbiturates 04/24/2023 NEGATIVE  <300 ng/mL Final   Benzodiazepines 04/24/2023 NEGATIVE  <100 ng/mL Final   Cocaine Metabolite 04/24/2023 NEGATIVE  <150 ng/mL Final   Marijuana Metabolite 04/24/2023 NEGATIVE  <20 ng/mL Final   Methadone Metabolite 04/24/2023 NEGATIVE  <100 ng/mL Final   Opiates 04/24/2023 NEGATIVE  <100 ng/mL Final   Oxycodone 04/24/2023 NEGATIVE  <100 ng/mL Final   Creatinine 04/24/2023 152.6  > or = 20.0 mg/dL Final   pH 87/69/7975 7.0  4.5 - 9.0 Final   Oxidant 04/24/2023 NEGATIVE  <200 mcg/mL Final   Notes and Comments 04/24/2023    Final  Office Visit on 11/03/2022  Component Date Value Ref Range Status   Anti Nuclear Antibody (ANA) 11/03/2022 Negative  Negative Final   Sed Rate 11/03/2022 46 (H)  0 - 20 mm/hr Final   CRP 11/03/2022 <1.0  0.5 - 20.0 mg/dL Final   TSH 92/88/7975 1.76  0.35 - 5.50 uIU/mL Final   Cholesterol 11/03/2022 142  0 - 200 mg/dL Final   Triglycerides 92/88/7975 189.0 (H)  0.0 - 149.0 mg/dL Final   HDL 92/88/7975 38.10 (L)  >  39.00  mg/dL Final   VLDL 92/88/7975 37.8  0.0 - 40.0 mg/dL Final   LDL Cholesterol 11/03/2022 66  0 - 99 mg/dL Final   Total CHOL/HDL Ratio 11/03/2022 4   Final   NonHDL 11/03/2022 104.22   Final   Hgb A1c MFr Bld 11/03/2022 5.6  4.6 - 6.5 % Final   Vitamin D  1, 25 (OH)2 Total 11/03/2022 40  18 - 72 pg/mL Final   Vitamin D3 1, 25 (OH)2 11/03/2022 40  pg/mL Final   Vitamin D2 1, 25 (OH)2 11/03/2022 <8  pg/mL Final   WBC 11/03/2022 5.7  4.0 - 10.5 K/uL Final   RBC 11/03/2022 4.95  3.87 - 5.11 Mil/uL Final   Hemoglobin 11/03/2022 12.2  12.0 - 15.0 g/dL Final   HCT 92/88/7975 38.9  36.0 - 46.0 % Final   MCV 11/03/2022 78.6  78.0 - 100.0 fl Final   MCHC 11/03/2022 31.4  30.0 - 36.0 g/dL Final   RDW 92/88/7975 17.0 (H)  11.5 - 15.5 % Final   Platelets 11/03/2022 248.0  150.0 - 400.0 K/uL Final   Neutrophils Relative % 11/03/2022 66.4  43.0 - 77.0 % Final   Lymphocytes Relative 11/03/2022 25.8  12.0 - 46.0 % Final   Monocytes Relative 11/03/2022 4.7  3.0 - 12.0 % Final   Eosinophils Relative 11/03/2022 1.9  0.0 - 5.0 % Final   Basophils Relative 11/03/2022 1.2  0.0 - 3.0 % Final   Neutro Abs 11/03/2022 3.8  1.4 - 7.7 K/uL Final   Lymphs Abs 11/03/2022 1.5  0.7 - 4.0 K/uL Final   Monocytes Absolute 11/03/2022 0.3  0.1 - 1.0 K/uL Final   Eosinophils Absolute 11/03/2022 0.1  0.0 - 0.7 K/uL Final   Basophils Absolute 11/03/2022 0.1  0.0 - 0.1 K/uL Final   Sodium 11/03/2022 137  135 - 145 mEq/L Final   Potassium 11/03/2022 4.0  3.5 - 5.1 mEq/L Final   Chloride 11/03/2022 103  96 - 112 mEq/L Final   CO2 11/03/2022 28  19 - 32 mEq/L Final   Glucose, Bld 11/03/2022 108 (H)  70 - 99 mg/dL Final   BUN 92/88/7975 9  6 - 23 mg/dL Final   Creatinine, Ser 11/03/2022 0.62  0.40 - 1.20 mg/dL Final   Total Bilirubin 11/03/2022 0.9  0.2 - 1.2 mg/dL Final   Alkaline Phosphatase 11/03/2022 67  39 - 117 U/L Final   AST 11/03/2022 17  0 - 37 U/L Final   ALT 11/03/2022 15  0 - 35 U/L Final   Total Protein  11/03/2022 7.1  6.0 - 8.3 g/dL Final   Albumin 92/88/7975 4.0  3.5 - 5.2 g/dL Final   GFR 92/88/7975 125.64  >60.00 mL/min Final   Calcium 11/03/2022 9.4  8.4 - 10.5 mg/dL Final   Vitamin A-87 92/88/7975 605  211 - 911 pg/mL Final   Folate 11/03/2022 6.1  >5.9 ng/mL Final   Total Iron  Binding Capacity 11/03/2022 370  250 - 450 ug/dL Final   UIBC 92/88/7975 334  131 - 425 ug/dL Final   Iron  11/03/2022 36  27 - 159 ug/dL Final   Iron  Saturation 11/03/2022 10 (L)  15 - 55 % Final   Ferritin 11/03/2022 10.8  10.0 - 291.0 ng/mL Final  Office Visit on 12/21/2021  Component Date Value Ref Range Status   Summary 12/21/2021 Note   Final  No image results found. No results found.       ASSESSMENT & PLAN   Assessment &  Plan Moderate episode of recurrent major depressive disorder (HCC) She reports lack of motivation and enjoyment, consistent with depression, and Lexapro  was ineffective. Anxiety is present but less severe. Wellbutrin  and Prozac  were discussed, considering side effects and benefits. Wellbutrin  will be prescribed initially for one week to assess response, with Prozac  to be added if Wellbutrin  is ineffective. Potential side effects, including weight changes and impact on libido, were discussed. No referral to psychiatry or counseling is planned at this time, per her preference. ADD (attention deficit disorder) without hyperactivity She is currently on Adderall 15 mg, which is effective, and prefers a 90-day supply for convenience. The next refill is planned for January 23, 2024. Suspected sleep apnea She reports snoring and daytime drowsiness, suggestive of sleep apnea, though no apneas or gasping during sleep have been observed. A home sleep study will be ordered to evaluate for sleep apnea. Encounter for contraceptive management, unspecified type  A Depo-Provera  injection will be administered for immediate contraception. The status of the Nexplanon  order [facility] at [facility] visit, with consideration for insertion if available. Menorrhagia with irregular cycle She has been experiencing continuous, light bleeding for two months, likely due to the contraceptive implant, raising concern for anemia. Immediate contraception is needed to prevent a lapse in protection. Blood work will be ordered to assess blood counts, liver, kidneys, and iron  levels.   ORDER ASSOCIATIONS  #   DIAGNOSIS / CONDITION ICD-10 ENCOUNTER ORDER     ICD-10-CM   1. Moderate episode of recurrent major depressive disorder (HCC)  F33.1 Fluoxetine  HCl, PMDD, 20 MG TABS    buPROPion  ER (WELLBUTRIN  SR) 100 MG 12 hr tablet    2. ADD (attention deficit disorder) without hyperactivity  F98.8     3. Suspected sleep apnea  R29.818 Home sleep test    4. Encounter for contraceptive management, unspecified type  Z30.9 medroxyPROGESTERone  (DEPO-PROVERA ) injection 150 mg    etonogestrel  (NEXPLANON ) 68 MG IMPL implant    5. Menorrhagia with irregular cycle  N92.1 CBC with Differential/Platelet    Comp Met (CMET)    Iron , TIBC and Ferritin Panel    medroxyPROGESTERone  (DEPO-PROVERA ) injection 150 mg     Meds ordered this encounter  Medications   Fluoxetine  HCl, PMDD, 20 MG TABS    Sig: Take 1 tablet (20 mg total) by mouth daily at 6 (six) AM. Start at half tablet daily for first 2 weeks. Wait until 1 week on Wellbutrin  prior to start.    Dispense:  90 tablet    Refill:  0   buPROPion  ER (WELLBUTRIN  SR) 100 MG 12 hr tablet    Sig: Take 1 tablet (100 mg total) by mouth in the morning.    Dispense:  90 tablet    Refill:  3   medroxyPROGESTERone  (DEPO-PROVERA ) injection 150 mg   etonogestrel  (NEXPLANON ) 68 MG IMPL implant    Sig: 1 each (68 mg total) by Subdermal route once for 1 dose.    Dispense:  1 each    Refill:  0      This document was synthesized by artificial intelligence ([employer]) using HIPAA-compliant recording of the clinical interaction;   We discussed  the use of AI scribe software for clinical note transcription with the patient, who gave verbal consent to proceed. additional Info: This encounter employed state-of-the-art, real-time, collaborative documentation. The patient actively reviewed and assisted in updating their electronic medical record on a shared screen, ensuring transparency and facilitating joint problem-solving  for the problem list, overview, and plan. This approach promotes accurate, informed care. The treatment plan was discussed and reviewed in detail, including medication safety, potential side effects, and all patient questions. We confirmed understanding and comfort with the plan. Follow-up instructions were established, including contacting the office for any concerns, returning if symptoms worsen, persist, or new symptoms develop, and precautions for potential emergency department visits.

## 2023-11-13 NOTE — Assessment & Plan Note (Signed)
 She has been experiencing continuous, light bleeding for two months, likely due to the contraceptive implant, raising concern for anemia. Immediate contraception is needed to prevent a lapse in protection. Blood work will be ordered to assess blood counts, liver, kidneys, and iron  levels.

## 2023-11-13 NOTE — Assessment & Plan Note (Signed)
 She is currently on Adderall 15 mg, which is effective, and prefers a 90-day supply for convenience. The next refill is planned for January 23, 2024.

## 2023-11-14 ENCOUNTER — Ambulatory Visit: Payer: Self-pay | Admitting: Internal Medicine

## 2023-11-14 ENCOUNTER — Encounter: Payer: Self-pay | Admitting: Internal Medicine

## 2023-11-14 DIAGNOSIS — E611 Iron deficiency: Secondary | ICD-10-CM

## 2023-11-14 DIAGNOSIS — E559 Vitamin D deficiency, unspecified: Secondary | ICD-10-CM

## 2023-11-14 NOTE — Progress Notes (Signed)
 We have nexplanon  already in office for pt next visit.

## 2023-11-15 MED ORDER — IRON (FERROUS SULFATE) 325 (65 FE) MG PO TABS: 325.0000 mg | ORAL_TABLET | Freq: Every day | ORAL | 3 refills | Status: AC

## 2023-12-15 ENCOUNTER — Ambulatory Visit: Admitting: Internal Medicine

## 2024-01-17 ENCOUNTER — Encounter: Payer: Self-pay | Admitting: Internal Medicine

## 2024-01-18 ENCOUNTER — Ambulatory Visit: Admitting: Internal Medicine

## 2024-01-21 ENCOUNTER — Telehealth: Admitting: Family

## 2024-01-21 DIAGNOSIS — L089 Local infection of the skin and subcutaneous tissue, unspecified: Secondary | ICD-10-CM

## 2024-01-21 MED ORDER — MUPIROCIN 2 % EX OINT: 1.0000 | TOPICAL_OINTMENT | Freq: Two times a day (BID) | CUTANEOUS | 0 refills | Status: AC

## 2024-01-21 NOTE — Progress Notes (Signed)
Approximately 5 minutes was spent documenting and reviewing patient's chart.

## 2024-01-21 NOTE — Progress Notes (Signed)
 E Visit for Rash  We are sorry that you are not feeling well. Here is how we plan to help!   A pimple and have sent in an antibiotic cream, Topical mupiricin    HOME CARE:  Take cool showers and avoid direct sunlight. Apply cool compress or wet dressings. Take a bath in an oatmeal bath.  Sprinkle content of one Aveeno packet under running faucet with comfortably warm water.  Bathe for 15-20 minutes, 1-2 times daily.  Pat dry with a towel. Do not rub the rash. Use hydrocortisone cream. Take an antihistamine like Benadryl for widespread rashes that itch.  The adult dose of Benadryl is 25-50 mg by mouth 4 times daily. Caution:  This type of medication may cause sleepiness.  Do not drink alcohol, drive, or operate dangerous machinery while taking antihistamines.  Do not take these medications if you have prostate enlargement.  Read package instructions thoroughly on all medications that you take.  GET HELP RIGHT AWAY IF:  Symptoms don't go away after treatment. Severe itching that persists. If you rash spreads or swells. If you rash begins to smell. If it blisters and opens or develops a yellow-brown crust. You develop a fever. You have a sore throat. You become short of breath.  MAKE SURE YOU:  Understand these instructions. Will watch your condition. Will get help right away if you are not doing well or get worse.  Thank you for choosing an e-visit.  Your e-visit answers were reviewed by a board certified advanced clinical practitioner to complete your personal care plan. Depending upon the condition, your plan could have included both over the counter or prescription medications.  Please review your pharmacy choice. Make sure the pharmacy is open so you can pick up prescription now. If there is a problem, you may contact your provider through [employer] and have the prescription routed to another pharmacy.  Your safety is important to us . If you have drug allergies check  your prescription carefully.   For the next 24 hours you can use MyChart to ask questions about today's visit, request a non-urgent call back, or ask for a work or school excuse. You will get an email in the next two days asking about your experience. I hope that your e-visit has been valuable and will speed your recovery.

## 2024-02-04 ENCOUNTER — Other Ambulatory Visit: Payer: Self-pay | Admitting: Internal Medicine

## 2024-02-04 DIAGNOSIS — F331 Major depressive disorder, recurrent, moderate: Secondary | ICD-10-CM

## 2024-02-06 ENCOUNTER — Telehealth: Payer: Self-pay

## 2024-02-06 ENCOUNTER — Encounter: Admitting: Internal Medicine

## 2024-02-06 NOTE — Telephone Encounter (Signed)
 Copied from CRM [id number]. Topic: Clinical - Medication Question >> Feb 06, 2024 11:48 AM Charolett L wrote: Reason for CRM: Patient is wondering what's the earliest she can come to get her birth control shot. Please give patient a call back   Please call and schedule pt for Nurse Visit.   FYI, Last injection was July 21,2025 October 6 - October 20, She is due for next injection.

## 2024-02-07 ENCOUNTER — Ambulatory Visit: Admitting: Internal Medicine

## 2024-02-07 ENCOUNTER — Other Ambulatory Visit: Payer: Self-pay | Admitting: Medical Genetics

## 2024-02-07 DIAGNOSIS — F988 Other specified behavioral and emotional disorders with onset usually occurring in childhood and adolescence: Secondary | ICD-10-CM | POA: Diagnosis not present

## 2024-02-07 DIAGNOSIS — F419 Anxiety disorder, unspecified: Secondary | ICD-10-CM | POA: Diagnosis not present

## 2024-02-07 DIAGNOSIS — Z309 Encounter for contraceptive management, unspecified: Secondary | ICD-10-CM

## 2024-02-07 DIAGNOSIS — Z006 Encounter for examination for normal comparison and control in clinical research program: Secondary | ICD-10-CM

## 2024-02-07 MED ORDER — AMPHETAMINE-DEXTROAMPHET ER 15 MG PO CP24: 15.0000 mg | ORAL_CAPSULE | ORAL | 0 refills | Status: AC
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
## 2024-02-13 ENCOUNTER — Telehealth: Payer: Self-pay

## 2024-02-13 NOTE — Telephone Encounter (Signed)
 Tried to call pt to see when last period was for finishing up paper work from appt last week she received nexplanon .

## 2024-02-16 ENCOUNTER — Ambulatory Visit: Admitting: Internal Medicine

## 2024-02-20 ENCOUNTER — Ambulatory Visit: Admitting: Internal Medicine

## 2024-03-06 MED ORDER — ETONOGESTREL 68 MG ~~LOC~~ IMPL
68.0000 mg | DRUG_IMPLANT | Freq: Once | SUBCUTANEOUS | Status: AC
  Administered 2024-02-07: 68 mg via SUBCUTANEOUS

## 2024-03-06 NOTE — Addendum Note (Signed)
 Addended by: Naara Kelty G on: 03/06/2024 08:22 AM   Modules accepted: Orders

## 2024-03-12 ENCOUNTER — Encounter: Admitting: Internal Medicine

## 2024-04-26 ENCOUNTER — Encounter: Payer: Self-pay | Admitting: Internal Medicine

## 2024-04-26 ENCOUNTER — Encounter: Admitting: Internal Medicine

## 2024-05-23 ENCOUNTER — Encounter: Admitting: Internal Medicine

## 2024-06-24 ENCOUNTER — Encounter: Admitting: Internal Medicine
